# Patient Record
Sex: Female | Born: 2000 | Race: White | Hispanic: No | Marital: Married | Smoking: Never smoker
Health system: Southern US, Community
[De-identification: ages and names within clinical notes are randomized; demographics above are authoritative.]

## PROBLEM LIST (undated history)

## (undated) HISTORY — PX: APPENDECTOMY: SHX54

---

## 2021-12-18 NOTE — L&D Delivery Note (Addendum)
Delivery Note  First Stage: Labor onset: 0900 Augmentation : AROM Analgesia /Anesthesia intrapartum: epidural AROM at 2040  Second Stage: Complete dilation at 2207 Onset of pushing at 2213 FHR second stage baseline 135bpm, moderate variability, accels present, occasional decelerations  Delivery of a viable female infant 07/31/2022 at 0047 by Donato Schultz, CNM. delivery of fetal head in OA position with restitution to ROA. No nuchal cord;  Anterior then posterior shoulders delivered easily with gentle downward traction. Baby placed on mom's chest, and attended to by peds.  Cord double clamped after cessation of pulsation, cut by FOB Cord blood sample collected   Third Stage: Placenta delivered Schultz intact with 3 VC @ 0100 Trailing membranes present, removed easily with ring forceps. Placenta disposition: discarded Uterine tone firm with massage / bleeding heavy Heavy uterine bleeding, plus heavy bleeding from 3rd degree laceration and cervical laceration. Pitocin bolus, TXA, and Cytotec rectal administered, and manual sweep performed. Uterine bleeding returned to normal scant amounts and uterus firm. Notified Dr. Jean Rosenthal of actively bleeding 3rd degree and cervical lacerations and requested his presence to repair the lacerations. Laceration bleeding controlled by tamponade by CNM until Dr. Jean Rosenthal present. Pt maintained consciousness throughout the postpartum hemorrhage, but was pale, dizzy, nauseas, and vomiting. BP 110-120s/70s, HR 120s-150s, SpO2 98-100%. Received verbal consent from patient for emergent blood transfusion of 1u PRBCs, plus 1L LR bolus. Two IV lines in place.  3rd degree laceration identified, plus cervical laceration  Anesthesia for repair: epidural, fentanyl IV x 2 Repair: by Dr. Jean Rosenthal, see Dr. Edison Pace note Est. Blood Loss (mL):  Complications: postpartum hemorrhage  Mom to postpartum.  Baby to Couplet care / Skin to  Skin.  Newborn: Birth Weight: 7lb 12oz  Apgar Scores: 9, 9 Feeding planned: breastfeeding

## 2022-01-26 DIAGNOSIS — Z3493 Encounter for supervision of normal pregnancy, unspecified, third trimester: Secondary | ICD-10-CM | POA: Insufficient documentation

## 2022-02-20 LAB — OB RESULTS CONSOLE VARICELLA ZOSTER ANTIBODY, IGG: Varicella: IMMUNE

## 2022-02-20 LAB — OB RESULTS CONSOLE RUBELLA ANTIBODY, IGM: Rubella: IMMUNE

## 2022-02-20 LAB — OB RESULTS CONSOLE HEPATITIS B SURFACE ANTIGEN: Hepatitis B Surface Ag: NEGATIVE

## 2022-03-01 ENCOUNTER — Other Ambulatory Visit: Payer: Self-pay

## 2022-03-01 ENCOUNTER — Emergency Department
Admission: EM | Admit: 2022-03-01 | Discharge: 2022-03-02 | Disposition: A | Discharge status: Home / Self Care | Payer: BC Managed Care – PPO | Attending: Emergency Medicine | Admitting: Emergency Medicine

## 2022-03-01 DIAGNOSIS — R7401 Elevation of levels of liver transaminase levels: Secondary | ICD-10-CM | POA: Insufficient documentation

## 2022-03-01 DIAGNOSIS — D72829 Elevated white blood cell count, unspecified: Secondary | ICD-10-CM | POA: Diagnosis not present

## 2022-03-01 DIAGNOSIS — O99612 Diseases of the digestive system complicating pregnancy, second trimester: Secondary | ICD-10-CM | POA: Diagnosis not present

## 2022-03-01 DIAGNOSIS — R1013 Epigastric pain: Secondary | ICD-10-CM

## 2022-03-01 DIAGNOSIS — K529 Noninfective gastroenteritis and colitis, unspecified: Secondary | ICD-10-CM | POA: Insufficient documentation

## 2022-03-01 LAB — COMPREHENSIVE METABOLIC PANEL
ALT: 62 U/L — ABNORMAL HIGH (ref 0–44)
AST: 26 U/L (ref 15–41)
Albumin: 3.7 g/dL (ref 3.5–5.0)
Alkaline Phosphatase: 59 U/L (ref 38–126)
Anion gap: 9 (ref 5–15)
BUN: 9 mg/dL (ref 6–20)
CO2: 24 mmol/L (ref 22–32)
Calcium: 9.1 mg/dL (ref 8.9–10.3)
Chloride: 102 mmol/L (ref 98–111)
Creatinine, Ser: 0.55 mg/dL (ref 0.44–1.00)
GFR, Estimated: >60 mL/min (ref >60)
Glucose, Bld: 96 mg/dL (ref 70–99)
Potassium: 3.4 mmol/L — ABNORMAL LOW (ref 3.5–5.1)
Sodium: 135 mmol/L (ref 135–145)
Total Bilirubin: 0.5 mg/dL (ref 0.3–1.2)
Total Protein: 7.2 g/dL (ref 6.5–8.1)

## 2022-03-01 LAB — CBC
HCT: 38.9 % (ref 36.0–46.0)
Hemoglobin: 13.4 g/dL (ref 12.0–15.0)
MCH: 28.6 pg (ref 26.0–34.0)
MCHC: 34.4 g/dL (ref 30.0–36.0)
MCV: 82.9 fL (ref 80.0–100.0)
Platelets: 351 10*3/uL (ref 150–400)
RBC: 4.69 MIL/uL (ref 3.87–5.11)
RDW: 13.6 % (ref 11.5–15.5)
WBC: 18.8 10*3/uL — ABNORMAL HIGH (ref 4.0–10.5)
nRBC: 0 % (ref 0.0–0.2)

## 2022-03-01 LAB — HCG, QUANTITATIVE, PREGNANCY: hCG, Beta Chain, Quant, S: 45322 m[IU]/mL — ABNORMAL HIGH (ref <5)

## 2022-03-01 LAB — URINALYSIS, ROUTINE W REFLEX MICROSCOPIC
Bilirubin Urine: NEGATIVE
Glucose, UA: NEGATIVE mg/dL
Hgb urine dipstick: NEGATIVE
Ketones, ur: 20 mg/dL — AB
Nitrite: NEGATIVE
Protein, ur: 30 mg/dL — AB
Specific Gravity, Urine: 1.02 (ref 1.005–1.030)
pH: 5 (ref 5.0–8.0)

## 2022-03-01 LAB — LIPASE, BLOOD: Lipase: 28 U/L (ref 11–51)

## 2022-03-01 MED ORDER — ONDANSETRON 4 MG PO TBDP
4.0000 mg | ORAL_TABLET | Freq: Once | ORAL | Status: AC
  Administered 2022-03-01: 4 mg via ORAL

## 2022-03-01 MED ORDER — ONDANSETRON HCL 4 MG/2ML IJ SOLN
4.0000 mg | Freq: Once | INTRAMUSCULAR | Status: AC
  Administered 2022-03-02: 4 mg via INTRAVENOUS
  Filled 2022-03-01: qty 2

## 2022-03-01 MED ORDER — DEXTROSE 5 % AND 0.9 % NACL IV BOLUS
1000.0000 mL | Freq: Once | INTRAVENOUS | Status: AC
  Administered 2022-03-02: 1000 mL via INTRAVENOUS
  Filled 2022-03-01: qty 1000

## 2022-03-01 MED ORDER — FAMOTIDINE IN NACL 20-0.9 MG/50ML-% IV SOLN
20.0000 mg | Freq: Once | INTRAVENOUS | Status: AC
  Administered 2022-03-02: 20 mg via INTRAVENOUS
  Filled 2022-03-01: qty 50

## 2022-03-01 MED ORDER — ONDANSETRON 4 MG PO TBDP
ORAL_TABLET | ORAL | Status: AC
  Filled 2022-03-01: qty 1

## 2022-03-01 NOTE — ED Triage Notes (Signed)
Pt presents to ER c/o emesis and upper abd pain that started today around 1800.  Pt states she is [redacted] weeks pregnant and has had prenatal care.  Pt states she has been feeling sick for a few days.  Pt denies any issues with pregnancy thus far.  Pt is a G1P0.   ?

## 2022-03-01 NOTE — ED Notes (Signed)
Attempted to obtain fetal heart tones with doppler, audible fetal heart tones, however, unable to get count.  ?

## 2022-03-02 ENCOUNTER — Emergency Department: Payer: BC Managed Care – PPO

## 2022-03-02 MED ORDER — ACETAMINOPHEN 500 MG PO TABS
1000.0000 mg | ORAL_TABLET | Freq: Once | ORAL | Status: AC
  Administered 2022-03-02: 1000 mg via ORAL
  Filled 2022-03-02: qty 2

## 2022-03-02 MED ORDER — ONDANSETRON 4 MG PO TBDP: 4.0000 mg | ORAL_TABLET | Freq: Three times a day (TID) | ORAL | 0 refills | Status: DC | PRN

## 2022-03-02 NOTE — ED Provider Notes (Signed)
? ?[facility] ?Provider Note ? ? ? Event Date/Time  ? First MD Initiated Contact with Patient 03/01/22 2321   ?  (approximate) ? ? ?History  ? ?Emesis and Abdominal Pain ? ? ?HPI ? ?Yvette Tran is a 21 y.o. female G1, P0 currently at [redacted] weeks pregnant who presents for evaluation of abdominal pain.  Patient reports that she has had diarrhea throughout the day today.  This evening she started having upper abdominal pain and vomiting.  Has had greater than 10 episodes of nonbloody nonbilious emesis.  She reports that the abdominal pain is only present when she is vomiting and after she vomits the pain goes away.  She is status post appendectomy several years ago.  No fever or chills, no chest pain or shortness of breath, no vaginal bleeding or vaginal discharge.  Husband had same symptoms last week. ?  ? ?PMH ?None ? ?Past Surgical History ?Appendectomy ? ?Physical Exam  ? ?Triage Vital Signs: ?ED Triage Vitals  ?Enc Vitals Group  ?   BP 03/01/22 2107 114/72  ?   Pulse Rate 03/01/22 2107 (!) 105  ?   Resp 03/01/22 2107 20  ?   Temp 03/01/22 2107 98.3 ?F (36.8 ?C)  ?   Temp Source 03/01/22 2107 Oral  ?   SpO2 03/01/22 2107 97 %  ?   Weight 03/01/22 2108 160 lb (72.6 kg)  ?   Height 03/01/22 2108 5\' 4"  (1.626 m)  ?   Head Circumference --   ?   Peak Flow --   ?   Pain Score 03/01/22 2107 5  ?   Pain Loc --   ?   Pain Edu? --   ?   Excl. in GC? --   ? ? ?Most recent vital signs: ?Vitals:  ? 03/02/22 0038 03/02/22 0139  ?BP: 113/68 111/66  ?Pulse: 98 90  ?Resp: 17 17  ?Temp:    ?SpO2: 98% 99%  ? ? ? ? ?Constitutional: Alert and oriented. Well appearing and in no apparent distress. ?HEENT: ?     Head: Normocephalic and atraumatic.    ?     Eyes: Conjunctivae are normal. Sclera is non-icteric.  ?     Mouth/Throat: Mucous membranes are moist.  ?     Neck: Supple with no signs of meningismus. ?Cardiovascular: Regular rate and rhythm. No murmurs, gallops, or rubs. 2+ symmetrical distal pulses are  present in all extremities.  ?Respiratory: Normal respiratory effort. Lungs are clear to auscultation bilaterally.  ?Gastrointestinal: Gravid with mild epigastric and LUQ tenderness, and non distended with positive bowel sounds. No rebound or guarding. ?Genitourinary: No CVA tenderness. ?Musculoskeletal:  No edema, cyanosis, or erythema of extremities. ?Neurologic: Normal speech and language. Face is symmetric. Moving all extremities. No gross focal neurologic deficits are appreciated. ?Skin: Skin is warm, dry and intact. No rash noted. ?Psychiatric: Mood and affect are normal. Speech and behavior are normal. ? ?ED Results / Procedures / Treatments  ? ?Labs ?(all labs ordered are listed, but only abnormal results are displayed) ?Labs Reviewed  ?COMPREHENSIVE METABOLIC PANEL - Abnormal; Notable for the following components:  ?    Result Value  ? Potassium 3.4 (*)   ? ALT 62 (*)   ? All other components within normal limits  ?CBC - Abnormal; Notable for the following components:  ? WBC 18.8 (*)   ? All other components within normal limits  ?URINALYSIS, ROUTINE W REFLEX MICROSCOPIC - Abnormal; Notable for the  following components:  ? Color, Urine YELLOW (*)   ? APPearance HAZY (*)   ? Ketones, ur 20 (*)   ? Protein, ur 30 (*)   ? Leukocytes,Ua TRACE (*)   ? Bacteria, UA RARE (*)   ? All other components within normal limits  ?HCG, QUANTITATIVE, PREGNANCY - Abnormal; Notable for the following components:  ? hCG, Beta Nyra JabsChain, Quant, [place] [id number] (*)   ? All other components within normal limits  ?LIPASE, BLOOD  ? ? ? ?EKG ? ?none ? ? ?RADIOLOGY ?I, Nita Sicklearolina Saphyre Cillo, attending MD, have personally viewed and interpreted the images obtained during this visit as below: ? ?Right upper quadrant ultrasound negative ? ? ?___________________________________________________ ?Interpretation by Radiologist:  ?US ABDOMEN LIMITED RUQ (LIVER/GB) ? ?Result Date: 03/02/2022 ?CLINICAL DATA:  Epigastric pain tonight. Eighteen weeks pregnant  currently. EXAM: ULTRASOUND ABDOMEN LIMITED RIGHT UPPER QUADRANT COMPARISON:  None. FINDINGS: Gallbladder: No gallstones or wall thickening visualized. No sonographic Murphy sign noted by sonographer. Common bile duct: Diameter: 2.5 mm, normal Liver: No focal lesion identified. Within normal limits in parenchymal echogenicity. Portal vein is patent on color Doppler imaging with normal direction of blood flow towards the liver. Other: None. IMPRESSION: Normal examination. No evidence of cholelithiasis or acute cholecystitis. Electronically Signed   By: Burman NievesWilliam  Stevens M.D.   On: 03/02/2022 02:09   ? ? ? ?PROCEDURES: ? ?Critical Care performed: No ? ?Procedures ? ? ? ?IMPRESSION / MDM / ASSESSMENT AND PLAN / ED COURSE  ?I reviewed the triage vital signs and the nursing notes. ? ?21 y.o. female G1, P0 currently at 5118 weeks pregnant who presents for evaluation of abdominal pain, vomiting, and diarrhea.  Patient is well-appearing in no distress, abdomen is gravid with mild epigastric and left upper quadrant tenderness, no rebound or guarding.  Vitals are within normal limits. ? ?Ddx: Viral gastroenteritis versus food poisoning versus pancreatitis versus gallbladder pathology versus UTI ? ? ?Plan: CBC, CMP, lipase, urinalysis, hCG, right upper quadrant ultrasound.  Will give IV Zofran, IV fluids, IV Pepcid, and Tylenol ? ? ?MEDICATIONS GIVEN IN ED: ?Medications  ?ondansetron (ZOFRAN-ODT) disintegrating tablet 4 mg (4 mg Oral Given 03/01/22 2113)  ?dextrose 5 % and 0.9% NaCl 5-0.9 % bolus 1,000 mL (0 mLs Intravenous Stopped 03/02/22 0045)  ?famotidine (PEPCID) IVPB 20 mg premix (0 mg Intravenous Stopped 03/02/22 0037)  ?ondansetron [facility](ZOFRAN) injection 4 mg (4 mg Intravenous Given 03/02/22 0002)  ?acetaminophen (TYLENOL) tablet 1,000 mg (1,000 mg Oral Given 03/02/22 0208)  ? ? ? ?ED COURSE: Labs showing elevated white count of 18.8 most likely from a viral process plus pregnancy.  Normal electrolytes with no significant  electrolyte derangements.  Slightly elevated ALT of 62 but normal AST and T. bili, normal lipase.  UA with no signs of infection showing mild ketones.  Right upper quadrant ultrasound negative for cholelithiasis or acute cholecystitis.  Bedside ultrasound showing normal IUP with a fetal heart rate of 149 and good fetal movement.  Patient symptoms resolved and she was tolerating p.o. no further episodes of vomiting or diarrhea in the emergency room.  Admission was considered but felt unnecessary with reassuring work-up and resolution of her symptoms.  We discussed rehydration, nausea control at home, and bland diet.  Recommended close follow-up with her primary care doctor and discussed my standard return precautions. ? ? ?Consults: None ? ? ?EMR reviewed including her initial prenatal visit with her OB from 10 days ago ? ? ? ?FINAL CLINICAL IMPRESSION(S) / ED DIAGNOSES  ? ?  Final diagnoses:  ?Epigastric abdominal pain  ?Nausea vomiting and diarrhea  ?Gastroenteritis  ? ? ? ?Rx / DC Orders  ? ?ED Discharge Orders   ? ?      Ordered  ?  ondansetron (ZOFRAN-ODT) 4 MG disintegrating tablet  Every 8 hours PRN       ? 03/02/22 0238  ? ?  ?  ? ?  ? ? ? ?Note:  This document was prepared using Dragon voice recognition software and may include unintentional dictation errors. ? ? ?Please note:  Patient was evaluated in Emergency Department today for the symptoms described in the history of present illness. Patient was evaluated in the context of the global COVID-19 pandemic, which necessitated consideration that the patient might be at risk for infection with the SARS-CoV-2 virus that causes COVID-19. Institutional protocols and algorithms that pertain to the evaluation of patients at risk for COVID-19 are in a state of rapid change based on information released by regulatory bodies including the CDC and federal and state organizations. These policies and algorithms were followed during the patient's care in the ED.  Some ED  evaluations and interventions may be delayed as a result of limited staffing during the pandemic. ? ? ? ? ?  ?Nita Sickle, MD ?03/02/22 0245 ? ?

## 2022-03-02 NOTE — ED Notes (Signed)
Patient states she took several sips of ginger ale and has, so far, been able to keep it down. No further needs at this time, VS are stable. Spouse remains at the bedside. ?

## 2022-03-02 NOTE — ED Notes (Signed)
Patient provided ginger ale for PO challenge 

## 2022-07-11 LAB — OB RESULTS CONSOLE GBS: GBS: NEGATIVE

## 2022-07-11 LAB — OB RESULTS CONSOLE RPR: RPR: NONREACTIVE

## 2022-07-11 LAB — OB RESULTS CONSOLE GC/CHLAMYDIA
Chlamydia: NEGATIVE
Neisseria Gonorrhea: NEGATIVE

## 2022-07-11 LAB — OB RESULTS CONSOLE HIV ANTIBODY (ROUTINE TESTING): HIV: NONREACTIVE

## 2022-07-30 ENCOUNTER — Inpatient Hospital Stay
Admission: EM | Admit: 2022-07-30 | Discharge: 2022-08-02 | DRG: 768 | Disposition: A | Discharge status: Home / Self Care | Payer: BC Managed Care – PPO | Attending: Obstetrics | Admitting: Obstetrics

## 2022-07-30 ENCOUNTER — Encounter: Payer: Self-pay | Admitting: Obstetrics and Gynecology

## 2022-07-30 ENCOUNTER — Other Ambulatory Visit: Payer: Self-pay

## 2022-07-30 ENCOUNTER — Inpatient Hospital Stay: Payer: BC Managed Care – PPO | Admitting: Anesthesiology

## 2022-07-30 DIAGNOSIS — Z3A39 39 weeks gestation of pregnancy: Secondary | ICD-10-CM

## 2022-07-30 DIAGNOSIS — D62 Acute posthemorrhagic anemia: Secondary | ICD-10-CM | POA: Diagnosis not present

## 2022-07-30 DIAGNOSIS — O26893 Other specified pregnancy related conditions, third trimester: Secondary | ICD-10-CM | POA: Diagnosis present

## 2022-07-30 DIAGNOSIS — O9081 Anemia of the puerperium: Secondary | ICD-10-CM | POA: Diagnosis not present

## 2022-07-30 DIAGNOSIS — O479 False labor, unspecified: Principal | ICD-10-CM | POA: Diagnosis present

## 2022-07-30 LAB — CBC
HCT: 38.2 % (ref 36.0–46.0)
Hemoglobin: 13.1 g/dL (ref 12.0–15.0)
MCH: 27.7 pg (ref 26.0–34.0)
MCHC: 34.3 g/dL (ref 30.0–36.0)
MCV: 80.8 fL (ref 80.0–100.0)
Platelets: 329 10*3/uL (ref 150–400)
RBC: 4.73 MIL/uL (ref 3.87–5.11)
RDW: 13.2 % (ref 11.5–15.5)
WBC: 12.2 10*3/uL — ABNORMAL HIGH (ref 4.0–10.5)
nRBC: 0 % (ref 0.0–0.2)

## 2022-07-30 LAB — ABO/RH: ABO/RH(D): O POS

## 2022-07-30 MED ORDER — LACTATED RINGERS IV SOLN: 500.0000 mL | INTRAVENOUS | Status: DC | PRN

## 2022-07-30 MED ORDER — EPHEDRINE 5 MG/ML INJ: 10.0000 mg | INTRAVENOUS | Status: DC | PRN

## 2022-07-30 MED ORDER — AMMONIA AROMATIC IN INHA
RESPIRATORY_TRACT | Status: AC
  Filled 2022-07-30: qty 10

## 2022-07-30 MED ORDER — ACETAMINOPHEN 325 MG PO TABS
650.0000 mg | ORAL_TABLET | ORAL | Status: DC | PRN
Start: 2022-07-30 — End: 2022-07-31

## 2022-07-30 MED ORDER — MISOPROSTOL 200 MCG PO TABS
ORAL_TABLET | ORAL | Status: AC
  Administered 2022-07-31: 800 ug
  Filled 2022-07-30: qty 4

## 2022-07-30 MED ORDER — LIDOCAINE HCL (PF) 1 % IJ SOLN
INTRAMUSCULAR | Status: DC | PRN
  Administered 2022-07-30: 3 mL via SUBCUTANEOUS

## 2022-07-30 MED ORDER — ONDANSETRON HCL 4 MG/2ML IJ SOLN
4.0000 mg | Freq: Four times a day (QID) | INTRAMUSCULAR | Status: DC | PRN
  Administered 2022-07-31: 4 mg via INTRAVENOUS
  Filled 2022-07-30: qty 2

## 2022-07-30 MED ORDER — PHENYLEPHRINE 80 MCG/ML (10ML) SYRINGE FOR IV PUSH (FOR BLOOD PRESSURE SUPPORT): 80.0000 ug | PREFILLED_SYRINGE | INTRAVENOUS | Status: DC | PRN

## 2022-07-30 MED ORDER — LACTATED RINGERS IV SOLN
500.0000 mL | Freq: Once | INTRAVENOUS | Status: AC
  Administered 2022-07-30: 500 mL via INTRAVENOUS

## 2022-07-30 MED ORDER — FENTANYL-BUPIVACAINE-NACL 0.5-0.125-0.9 MG/250ML-% EP SOLN
12.0000 mL/h | EPIDURAL | Status: DC | PRN
  Administered 2022-07-30: 12 mL/h via EPIDURAL

## 2022-07-30 MED ORDER — OXYTOCIN 10 UNIT/ML IJ SOLN
INTRAMUSCULAR | Status: AC
  Filled 2022-07-30: qty 2

## 2022-07-30 MED ORDER — OXYTOCIN-SODIUM CHLORIDE 30-0.9 UT/500ML-% IV SOLN
INTRAVENOUS | Status: AC
  Filled 2022-07-30: qty 500

## 2022-07-30 MED ORDER — FENTANYL CITRATE (PF) 100 MCG/2ML IJ SOLN
50.0000 ug | INTRAMUSCULAR | Status: DC | PRN
  Administered 2022-07-31: 50 ug via INTRAVENOUS
  Filled 2022-07-30: qty 2

## 2022-07-30 MED ORDER — LACTATED RINGERS IV SOLN: INTRAVENOUS | Status: DC

## 2022-07-30 MED ORDER — OXYTOCIN BOLUS FROM INFUSION
333.0000 mL | Freq: Once | INTRAVENOUS | Status: AC
  Administered 2022-07-31: 333 mL via INTRAVENOUS

## 2022-07-30 MED ORDER — FENTANYL-BUPIVACAINE-NACL 0.5-0.125-0.9 MG/250ML-% EP SOLN
EPIDURAL | Status: AC
  Filled 2022-07-30: qty 250

## 2022-07-30 MED ORDER — SODIUM CHLORIDE 0.9 % IV SOLN
INTRAVENOUS | Status: DC | PRN
  Administered 2022-07-30: 9 mL via EPIDURAL

## 2022-07-30 MED ORDER — LIDOCAINE HCL (PF) 1 % IJ SOLN: 30.0000 mL | INTRAMUSCULAR | Status: DC | PRN

## 2022-07-30 MED ORDER — LIDOCAINE HCL (PF) 1 % IJ SOLN
INTRAMUSCULAR | Status: AC
  Filled 2022-07-30: qty 30

## 2022-07-30 MED ORDER — SOD CITRATE-CITRIC ACID 500-334 MG/5ML PO SOLN: 30.0000 mL | ORAL | Status: DC | PRN

## 2022-07-30 MED ORDER — LIDOCAINE-EPINEPHRINE (PF) 1.5 %-1:200000 IJ SOLN
INTRAMUSCULAR | Status: DC | PRN
  Administered 2022-07-30: 3 mL via EPIDURAL

## 2022-07-30 MED ORDER — DIPHENHYDRAMINE HCL 50 MG/ML IJ SOLN: 12.5000 mg | INTRAMUSCULAR | Status: DC | PRN

## 2022-07-30 MED ORDER — OXYTOCIN-SODIUM CHLORIDE 30-0.9 UT/500ML-% IV SOLN
2.5000 [IU]/h | INTRAVENOUS | Status: DC
Start: 2022-07-30 — End: 2022-07-31
  Administered 2022-07-31: 2.5 [IU]/h via INTRAVENOUS
  Filled 2022-07-30: qty 500

## 2022-07-30 NOTE — H&P (Signed)
OB History & Physical   History of Present Illness:  Chief Complaint:   HPI:  Yvette Tran is a 21 y.o. G1P0 female at [redacted]w[redacted]d dated by LMP.  She presents to L&D for uterine contractions.  Active FM onset of ctx @ 0900 currently every 3-4 minutes Denies LOF.    Pregnancy Issues: 1. Low-risk pregnancy   Maternal Medical History:  History reviewed. No pertinent past medical history.  Past Surgical History:  Procedure Laterality Date   APPENDECTOMY      No Known Allergies  Prior to Admission medications   Medication Sig Start Date End Date Taking? Authorizing Provider  Prenatal Vit-Fe Fumarate-FA (MULTIVITAMIN-PRENATAL) 27-0.8 MG TABS tablet Take 1 tablet by mouth daily at 12 noon.   Yes [provider]  ondansetron (ZOFRAN-ODT) 4 MG disintegrating tablet Take 1 tablet (4 mg total) by mouth every 8 (eight) hours as needed for nausea or vomiting. 03/02/22   Nita Sickle, MD    Prenatal care site: [facility]   Social History: She  reports that she has never smoked. She has never used smokeless tobacco. She reports that she does not drink alcohol and does not use drugs.  Family History: family history is not on file.   Review of Systems: A full review of systems was performed and negative except as noted in the HPI.     Physical Exam:  Vital Signs: There were no vitals taken for this visit. General: no acute distress.  HEENT: normocephalic, atraumatic Heart: regular rate & rhythm.  No murmurs/rubs/gallops Lungs: clear to auscultation bilaterally, normal respiratory effort Abdomen: soft, gravid, non-tender;  EFW: 7.5lb Pelvic:   External: Normal external female genitalia  Cervix: Dilation: 6 / Effacement (%): 100 / Station: 0    Extremities: non-tender, symmetric, no edema bilaterally.  DTRs: +2  Neurologic: Alert & oriented x 3.    No results found for this or any previous visit (from the past 24 hour(s)).  Pertinent Results:  Prenatal  Labs: Blood type/Rh O pos  Antibody screen neg  Rubella Immune  Varicella Immune  RPR NR  HBsAg Neg  HIV NR  GC neg  Chlamydia neg  Genetic screening negative  1 hour GTT 104  3 hour GTT N/a  GBS Negative   FHT: 130bpm, moderate variability, accels present, no decelerations TOCO: contractions q3-70min SVE:  Dilation: 6 / Effacement (%): 100 / Station: 0    Cephalic by leopolds  No results found.  Assessment:  Yvette Tran is a 21 y.o. G1P0 female at [redacted]w[redacted]d with active labor.   Plan:  1. Admit to Labor & Delivery; consents reviewed and obtained  2. Fetal Well being  - Fetal Tracing: Cat I tracing - Group B Streptococcus ppx indicated: n/a, GBS negative - Presentation: vertex confirmed by SVE   3. Routine OB: - Prenatal labs reviewed, as above - Rh pos - CBC, T&S, RPR on admit - Clear fluids, IVF  4. Monitoring of Labor -  Contractions q3-39min, external toco in place -  Plan for continuous fetal monitoring  -  Maternal pain control as desired; requesting regional anesthesia - Anticipate vaginal delivery  5. Post Partum Planning: - Infant feeding: breastfeeding - Contraception: none  Janyce Llanos, CNM 07/30/22 5:47 PM

## 2022-07-30 NOTE — Progress Notes (Signed)
Labor Progress Note  Yvette Tran is a 21 y.o. G1P0 at [redacted]w[redacted]d by LMP admitted for active labor  Subjective: She is comfortable after her epidural  Objective: BP 109/66   Pulse 86   Temp 98.3 F (36.8 C) (Oral)   Resp 19   Ht 5\' 4"  (1.626 m)   Wt 85.7 kg   SpO2 99%   BMI 32.44 kg/m  Notable VS details: reviewed  Fetal Assessment: FHT:  FHR: 125 bpm, variability: moderate,  accelerations:  Present,  decelerations:  Absent Category/reactivity:  Category I UC:   regular, every 2-4 minutes SVE:    Dilation: 9cm  Effacement: 100%  Station:  +1  Consistency: soft  Position: anterior  Membrane status:AROM @ 2040 Amniotic color: clear  Labs: Lab Results  Component Value Date   WBC 12.2 (H) 07/30/2022   HGB 13.1 07/30/2022   HCT 38.2 07/30/2022   MCV 80.8 07/30/2022   PLT 329 07/30/2022    Assessment / Plan: Spontaneous labor, progressing normally  Labor:  Progressing normally, comfortable after epidural, AROM Preeclampsia:  no signs or symptoms of toxicity Fetal Wellbeing:  Category I Pain Control:  Epidural I/D:   GBS negative Anticipated MOD:  NSVD  08/01/2022, CNM 07/30/2022, 9:15 PM

## 2022-07-30 NOTE — Anesthesia Procedure Notes (Signed)
Epidural Patient location during procedure: OB  Staffing Anesthesiologist: Corinda Gubler, MD Performed: anesthesiologist   Preanesthetic Checklist Completed: patient identified, IV checked, site marked, risks and benefits discussed, surgical consent, monitors and equipment checked, pre-op evaluation and timeout performed  Epidural Patient position: sitting Prep: ChloraPrep Patient monitoring: heart rate, continuous pulse ox and blood pressure Approach: midline Location: L3-L4 Injection technique: LOR saline  Needle:  Needle type: Tuohy  Needle gauge: 17 G Needle length: 9 cm Needle insertion depth: 7 cm Catheter type: closed end flexible Catheter size: 19 Gauge Catheter at skin depth: 12 cm Test dose: negative and 1.5% lidocaine with Epi 1:200 K  Assessment Sensory level: T10 Events: blood not aspirated, injection not painful, no injection resistance, paresthesia and negative IV test  Additional Notes First/one attempt. Patient had transient paresthesia down right leg on advancement of catheter through Tuohy, that self-resolved rapidly. No further paresthesias on test dose or bolus dose administration  Pt. Evaluated and documentation done after procedure finished. Patient identified. Risks/Benefits/Options discussed with patient including but not limited to bleeding, infection, nerve damage, paralysis, failed block, incomplete pain control, headache, blood pressure changes, nausea, vomiting, reactions to medication both or allergic, itching and postpartum back pain. Confirmed with bedside nurse the patient's most recent platelet count. Confirmed with patient that they are not currently taking any anticoagulation, have any bleeding history or any family history of bleeding disorders. Patient expressed understanding and wished to proceed. All questions were answered. Sterile technique was used throughout the entire procedure. Please see nursing notes for vital signs. Test dose was  given through epidural catheter and negative prior to continuing to dose epidural or start infusion. Warning signs of high block given to the patient including shortness of breath, tingling/numbness in hands, complete motor block, or any concerning symptoms with instructions to call for help. Patient was given instructions on fall risk and not to get out of bed. All questions and concerns addressed with instructions to call with any issues or inadequate analgesia.     Patient tolerated the insertion well without immediate complications.  Reason for block: procedure for painReason for block:procedure for pain

## 2022-07-30 NOTE — Anesthesia Preprocedure Evaluation (Signed)
Anesthesia Evaluation  Patient identified by MRN, date of birth, ID band Patient awake    Reviewed: Allergy & Precautions, NPO status , Patient's Chart, lab work & pertinent test results  History of Anesthesia Complications Negative for: history of anesthetic complications  Airway Mallampati: II  TM Distance: >3 FB Neck ROM: Full    Dental no notable dental hx. (+) Teeth Intact   Pulmonary neg pulmonary ROS, neg sleep apnea, neg COPD, Patient abstained from smoking.Not current smoker,    Pulmonary exam normal breath sounds clear to auscultation       Cardiovascular Exercise Tolerance: Good METS(-) hypertension(-) CAD and (-) Past MI negative cardio ROS  (-) dysrhythmias  Rhythm:Regular Rate:Normal - Systolic murmurs    Neuro/Psych negative neurological ROS  negative psych ROS   GI/Hepatic neg GERD  ,(+)     (-) substance abuse  ,   Endo/Other  neg diabetes  Renal/GU negative Renal ROS     Musculoskeletal   Abdominal   Peds  Hematology   Anesthesia Other Findings History reviewed. No pertinent past medical history.  Reproductive/Obstetrics (+) Pregnancy                             Anesthesia Physical Anesthesia Plan  ASA: 2  Anesthesia Plan: Epidural   Post-op Pain Management:    Induction:   PONV Risk Score and Plan: 2 and Treatment may vary due to age or medical condition and Ondansetron  Airway Management Planned: Natural Airway  Additional Equipment:   Intra-op Plan:   Post-operative Plan:   Informed Consent: I have reviewed the patients History and Physical, chart, labs and discussed the procedure including the risks, benefits and alternatives for the proposed anesthesia with the patient or authorized representative who has indicated his/her understanding and acceptance.       Plan Discussed with: Surgeon  Anesthesia Plan Comments: (Discussed R/B/A of  neuraxial anesthesia technique with patient: - rare risks of spinal/epidural hematoma, nerve damage, infection - Risk of PDPH - Risk of itching - Risk of nausea and vomiting - Risk of poor block necessitating replacement of epidural. - Risk of allergic reactions. Patient voiced understanding.)        Anesthesia Quick Evaluation  

## 2022-07-31 ENCOUNTER — Encounter: Payer: Self-pay | Admitting: Obstetrics and Gynecology

## 2022-07-31 LAB — CBC
HCT: 34.6 % — ABNORMAL LOW (ref 36.0–46.0)
HCT: 27.1 % — ABNORMAL LOW (ref 36.0–46.0)
Hemoglobin: 11.7 g/dL — ABNORMAL LOW (ref 12.0–15.0)
Hemoglobin: 9.5 g/dL — ABNORMAL LOW (ref 12.0–15.0)
MCH: 27.9 pg (ref 26.0–34.0)
MCH: 29 pg (ref 26.0–34.0)
MCHC: 33.8 g/dL (ref 30.0–36.0)
MCHC: 35.1 g/dL (ref 30.0–36.0)
MCV: 82.6 fL (ref 80.0–100.0)
MCV: 82.6 fL (ref 80.0–100.0)
Platelets: 395 10*3/uL (ref 150–400)
Platelets: 246 10*3/uL (ref 150–400)
RBC: 4.19 MIL/uL (ref 3.87–5.11)
RBC: 3.28 MIL/uL — ABNORMAL LOW (ref 3.87–5.11)
RDW: 13.4 % (ref 11.5–15.5)
RDW: 13.6 % (ref 11.5–15.5)
WBC: 16.9 10*3/uL — ABNORMAL HIGH (ref 4.0–10.5)
WBC: 15.6 10*3/uL — ABNORMAL HIGH (ref 4.0–10.5)
nRBC: 0 % (ref 0.0–0.2)
nRBC: 0 % (ref 0.0–0.2)

## 2022-07-31 LAB — DIC (DISSEMINATED INTRAVASCULAR COAGULATION)PANEL
D-Dimer, Quant: 2.89 ug/mL-FEU — ABNORMAL HIGH (ref 0.00–0.50)
Fibrinogen: 541 mg/dL — ABNORMAL HIGH (ref 210–475)
INR: 1.1 (ref 0.8–1.2)
Platelets: 390 10*3/uL (ref 150–400)
Prothrombin Time: 13.7 seconds (ref 11.4–15.2)
Smear Review: NONE SEEN
aPTT: 24 seconds (ref 24–36)

## 2022-07-31 LAB — RPR: RPR Ser Ql: NONREACTIVE

## 2022-07-31 LAB — HEMOGLOBIN AND HEMATOCRIT, BLOOD
HCT: 31.1 % — ABNORMAL LOW (ref 36.0–46.0)
Hemoglobin: 10.8 g/dL — ABNORMAL LOW (ref 12.0–15.0)

## 2022-07-31 LAB — PREPARE RBC (CROSSMATCH)

## 2022-07-31 MED ORDER — LACTATED RINGERS IV SOLN: INTRAVENOUS | Status: DC

## 2022-07-31 MED ORDER — OXYCODONE HCL 5 MG PO TABS
5.0000 mg | ORAL_TABLET | ORAL | Status: DC | PRN
  Administered 2022-07-31 – 2022-08-02 (×7): 5 mg via ORAL
  Filled 2022-07-31 (×7): qty 1

## 2022-07-31 MED ORDER — SODIUM CHLORIDE 0.9% IV SOLUTION: Freq: Once | INTRAVENOUS | Status: DC

## 2022-07-31 MED ORDER — ONDANSETRON 4 MG PO TBDP: 4.0000 mg | ORAL_TABLET | Freq: Three times a day (TID) | ORAL | Status: DC | PRN

## 2022-07-31 MED ORDER — WITCH HAZEL-GLYCERIN EX PADS
1.0000 | MEDICATED_PAD | CUTANEOUS | Status: DC | PRN
  Filled 2022-07-31 (×2): qty 100

## 2022-07-31 MED ORDER — FERROUS SULFATE 325 (65 FE) MG PO TABS
325.0000 mg | ORAL_TABLET | Freq: Two times a day (BID) | ORAL | Status: DC
  Administered 2022-07-31 – 2022-08-02 (×4): 325 mg via ORAL
  Filled 2022-07-31 (×4): qty 1

## 2022-07-31 MED ORDER — TRANEXAMIC ACID-NACL 1000-0.7 MG/100ML-% IV SOLN
INTRAVENOUS | Status: AC
  Administered 2022-07-31: 1000 mg
  Filled 2022-07-31: qty 100

## 2022-07-31 MED ORDER — DIBUCAINE (PERIANAL) 1 % EX OINT
1.0000 | TOPICAL_OINTMENT | CUTANEOUS | Status: DC | PRN
  Filled 2022-07-31 (×2): qty 28

## 2022-07-31 MED ORDER — PRENATAL MULTIVITAMIN CH
1.0000 | ORAL_TABLET | Freq: Every day | ORAL | Status: DC
  Administered 2022-07-31 – 2022-08-01 (×2): 1 via ORAL
  Filled 2022-07-31 (×2): qty 1

## 2022-07-31 MED ORDER — DIPHENHYDRAMINE HCL 25 MG PO CAPS: 25.0000 mg | ORAL_CAPSULE | Freq: Four times a day (QID) | ORAL | Status: DC | PRN

## 2022-07-31 MED ORDER — FENTANYL CITRATE (PF) 100 MCG/2ML IJ SOLN
50.0000 ug | Freq: Once | INTRAMUSCULAR | Status: DC
  Filled 2022-07-31: qty 2

## 2022-07-31 MED ORDER — SODIUM CHLORIDE 0.9 % IV SOLN
300.0000 mg | INTRAVENOUS | Status: DC
  Administered 2022-07-31: 300 mg via INTRAVENOUS
  Filled 2022-07-31: qty 15

## 2022-07-31 MED ORDER — TETANUS-DIPHTH-ACELL PERTUSSIS 5-2.5-18.5 LF-MCG/0.5 IM SUSY: 0.5000 mL | PREFILLED_SYRINGE | Freq: Once | INTRAMUSCULAR | Status: AC

## 2022-07-31 MED ORDER — SENNOSIDES-DOCUSATE SODIUM 8.6-50 MG PO TABS
2.0000 | ORAL_TABLET | Freq: Every day | ORAL | Status: DC
  Administered 2022-08-01 – 2022-08-02 (×2): 2 via ORAL
  Filled 2022-07-31 (×2): qty 2

## 2022-07-31 MED ORDER — ACETAMINOPHEN 325 MG PO TABS
650.0000 mg | ORAL_TABLET | ORAL | Status: DC | PRN
  Administered 2022-07-31 – 2022-08-01 (×2): 650 mg via ORAL
  Filled 2022-07-31 (×2): qty 2

## 2022-07-31 MED ORDER — DOCUSATE SODIUM 100 MG PO CAPS
100.0000 mg | ORAL_CAPSULE | Freq: Two times a day (BID) | ORAL | Status: DC
  Administered 2022-08-01 – 2022-08-02 (×3): 100 mg via ORAL
  Filled 2022-07-31 (×3): qty 1

## 2022-07-31 MED ORDER — IBUPROFEN 600 MG PO TABS
600.0000 mg | ORAL_TABLET | Freq: Four times a day (QID) | ORAL | Status: DC
  Administered 2022-07-31 – 2022-08-02 (×9): 600 mg via ORAL
  Filled 2022-07-31 (×9): qty 1

## 2022-07-31 MED ORDER — LACTATED RINGERS IV BOLUS: 1000.0000 mL | Freq: Once | INTRAVENOUS | Status: DC

## 2022-07-31 MED ORDER — BENZOCAINE-MENTHOL 20-0.5 % EX AERO
1.0000 | INHALATION_SPRAY | CUTANEOUS | Status: DC | PRN
  Filled 2022-07-31: qty 56

## 2022-07-31 MED ORDER — SIMETHICONE 80 MG PO CHEW: 80.0000 mg | CHEWABLE_TABLET | ORAL | Status: DC | PRN

## 2022-07-31 MED ORDER — ZOLPIDEM TARTRATE 5 MG PO TABS: 5.0000 mg | ORAL_TABLET | Freq: Every evening | ORAL | Status: DC | PRN

## 2022-07-31 MED ORDER — ONDANSETRON HCL 4 MG PO TABS: 4.0000 mg | ORAL_TABLET | ORAL | Status: DC | PRN

## 2022-07-31 MED ORDER — COCONUT OIL OIL: 1.0000 | TOPICAL_OIL | Status: DC | PRN

## 2022-07-31 MED ORDER — FENTANYL CITRATE (PF) 100 MCG/2ML IJ SOLN
50.0000 ug | Freq: Once | INTRAMUSCULAR | Status: AC
  Administered 2022-07-31: 50 ug via INTRAVENOUS

## 2022-07-31 MED ORDER — ONDANSETRON HCL 4 MG/2ML IJ SOLN: 4.0000 mg | INTRAMUSCULAR | Status: DC | PRN

## 2022-07-31 NOTE — Discharge Summary (Signed)
Obstetrical Discharge Summary  Patient Name: Yvette Tran DOB: 2001/08/28 MRN: [id number]  Date of Admission: 07/30/2022 Date of Delivery: 07/31/2022 Delivered by: Donato Schultz, CNM Date of Discharge: 08/02/22  Primary OB: [facility]  LMP:10/25/21 EDC Estimated Date of Delivery: 08/01/22 Gestational Age at Delivery: [redacted]w[redacted]d   Antepartum complications:  Low risk pregnancy Admitting Diagnosis: active labor, NSVD Secondary Diagnosis: postpartum hemorrhage, 3rd degree laceration Patient Active Problem List   Diagnosis Date Noted   Postpartum hemorrhage, delivered 08/01/2022   Acute blood loss anemia 08/01/2022   Uterine contractions 07/30/2022   Encounter for supervision of normal pregnancy in third trimester 01/26/2022    Augmentation: AROM Complications: Hemorrhage>1038mL Intrapartum complications/course: She arrived with uterine contractions and received an epidural, then progressed to 10/100/+2 and pushed for 2.5 hours, delivering viable female infant. 3rd degree laceration and cervical laceration repaired by Dr. Jean Rosenthal, postpartum hemorrhage of . Received 1 unit PRBCs emergently and Venofer postpartum. Date of Delivery: 07/31/2022 Delivered By: Donato Schultz, CNM Delivery Type: spontaneous vaginal delivery Anesthesia: epidural Placenta: spontaneous Laceration: 3rd degree, cervical Episiotomy: none Newborn Data: Live born female "Marlan Palau" Birth Weight:  7lb 12oz APGAR: 9, 9  Newborn Delivery   Birth date/time: 07/31/2022 00:47:00 Delivery type: Vaginal, Spontaneous      Postpartum Procedures: blood transfusion, IV Venofer Edinburgh:     07/31/2022    4:10 PM  Edinburgh Postnatal Depression Scale Screening Tool  I have been able to laugh and see the funny side of things. 0  I have looked forward with enjoyment to things. 0  I have blamed myself unnecessarily when things went wrong. 2  I have been anxious or worried for no good reason. 1  I have  felt scared or panicky for no good reason. 1  Things have been getting on top of me. 1  I have been so unhappy that I have had difficulty sleeping. 0  I have felt sad or miserable. 0  I have been so unhappy that I have been crying. 0  The thought of harming myself has occurred to me. 0  Edinburgh Postnatal Depression Scale Total 5    Post partum course:  Patient had a postpartum course complicated by postpartum hemorrhage and blood transfusion.  By time of discharge on PPD#2, her pain was controlled on oral pain medications; she had appropriate lochia and was ambulating, voiding without difficulty and tolerating regular diet.  She was deemed stable for discharge to home.     Discharge Physical Exam:  BP 113/71 (BP Location: Right Arm)   Pulse 88   Temp 98.4 F (36.9 C) (Oral)   Resp 20   Ht 5\' 4"  (1.626 m)   Wt 85.7 kg   SpO2 100%   Breastfeeding Unknown   BMI 32.44 kg/m   General: NAD CV: RRR Pulm: CTABL, nl effort ABD: s/nd/nt, fundus firm and below the umbilicus Lochia: moderate Perineum: well approximated/intact DVT Evaluation: LE non-ttp, no evidence of DVT on exam.  Hemoglobin  Date Value Ref Range Status  08/02/2022 9.5 (L) 12.0 - 15.0 g/dL Final   HCT  Date Value Ref Range Status  08/02/2022 28.0 (L) 36.0 - 46.0 % Final    Disposition: stable, discharge to home. Baby Feeding: breastmilk Baby Disposition: home with mom  Rh Immune globulin given: O pos Rubella vaccine given: immune Varicella vaccine given: immune Tdap vaccine given in AP or PP setting: 06/08/22 Flu vaccine given in AP or PP setting: give in season  Contraception: declines  Prenatal Labs:  Blood type/Rh O pos  Antibody screen neg  Rubella Immune  Varicella Immune  RPR NR  HBsAg Neg  HIV NR  GC neg  Chlamydia neg  Genetic screening negative  1 hour GTT 104  3 hour GTT N/a  GBS Negative     Plan:  Yvette Tran was discharged to home in good condition. Follow-up appointment  with delivering provider in 6 weeks.  Discharge Medications: Allergies as of 08/02/2022   No Known Allergies      Medication List     STOP taking these medications    ondansetron 4 MG disintegrating tablet Commonly known as: ZOFRAN-ODT       TAKE these medications    acetaminophen 325 MG tablet Commonly known as: Tylenol Take 2 tablets (650 mg total) by mouth every 4 (four) hours as needed (for pain scale < 4).   benzocaine-Menthol 20-0.5 % Aero Commonly known as: DERMOPLAST Apply 1 Application topically as needed for irritation (perineal discomfort).   coconut oil Oil Apply 1 Application topically as needed.   dibucaine 1 % Oint Commonly known as: NUPERCAINAL Place 1 Application rectally as needed for hemorrhoids.   docusate sodium 100 MG capsule Commonly known as: COLACE Take 1 capsule (100 mg total) by mouth 2 (two) times daily.   ferrous sulfate 325 (65 FE) MG tablet Take 1 tablet (325 mg total) by mouth 2 (two) times daily with a meal.   ibuprofen 600 MG tablet Commonly known as: ADVIL Take 1 tablet (600 mg total) by mouth every 6 (six) hours.   multivitamin-prenatal 27-0.8 MG Tabs tablet Take 1 tablet by mouth daily at 12 noon.   senna-docusate 8.6-50 MG tablet Commonly known as: Senokot-S Take 2 tablets by mouth daily. Start taking on: August 03, 2022   simethicone 80 MG chewable tablet Commonly known as: MYLICON Chew 1 tablet (80 mg total) by mouth as needed for flatulence.   witch hazel-glycerin pad Commonly known as: TUCKS Apply 1 Application topically as needed for hemorrhoids.         Follow-up Information     Conard Novak, MD Follow up in 2 week(s).   Specialty: Obstetrics and Gynecology Why: laceration check Contact information: [address] [place] [address] [phone]         Janyce Llanos, CNM Follow up in 6 week(s).   Specialty: Certified Nurse Midwife Why: 6wk postpartum Contact  information: [address] [place] [address] [phone]                 Signed:  Sonny Dandy, CNM 08/02/2022 10:16 AM

## 2022-07-31 NOTE — Lactation Note (Signed)
This note was copied from a baby's chart. Lactation Consultation Note  Patient Name: Yvette Tran Today's Date: 07/31/2022 Reason for consult: Initial assessment;Primapara;Term Age:23 hours  Maternal Data Has patient been taught Hand Expression?: Yes Does the patient have breastfeeding experience prior to this delivery?: No  P1, SVD 9hrs ago. Desires exclusive BF.  Feeding Mother's Current Feeding Choice: Breast Milk  Baby has fed since delivery and also received supplementation of donor breast milk.  LATCH Score Latch: Repeated attempts needed to sustain latch, nipple held in mouth throughout feeding, stimulation needed to elicit sucking reflex.  Audible Swallowing: A few with stimulation  Type of Nipple: Flat  Comfort (Breast/Nipple): Soft / non-tender  Hold (Positioning): Assistance needed to correctly position infant at breast and maintain latch.  LATCH Score: 6  Mom has flat nipples bilaterally that do not appear to evert more with stimulation or hand expression. Baby has shallow latch without support of nipple shield and mom in pain with pinched nipple. Nipple shield applied; baby latched well, adjusted into a rhythmic suckling pattern, occasional swallow but need continuous stimulation to remain awake.  Lactation Tools Discussed/Used Tools: Nipple Shields Nipple shield size: 20  Nipple shield previously given by transition RN due to flat nipples/difficult latch.  Interventions Interventions: Breast feeding basics reviewed;Assisted with latch;Hand express;Reverse pressure;Breast compression;Adjust position;Education (nipple shield use, normal newborn feeding patterns/behaviors, early cues, position, alignment, keeping baby awake at the breast, output expectations)  Discharge    Consult Status Consult Status: Follow-up  Whiteboard updated with [facility] name/contact information.  Danford Bad 07/31/2022, 10:36 AM

## 2022-07-31 NOTE — Progress Notes (Signed)
Called to see patient due to postpartum hemorrhage, suspected 3rd degree laceration, and suspected cervical laceration.  Due to blood loss the patient had already received pitocin, TXA, and misoprostol (per rectum).  Her vital signs were stable with mild tachycardia (HR 120s).  She was mentating well and was responsive. The CNM was holding pressure on the cervix.  The cervix was inspected and while not bleeding there was a small (1.5-2 cm) laceration, which was repaired using 0 Vicryl in a running-locked fashion. This was hemostatic after repair.  Attention was turned to the anal area and rectum. The anus was still intact as was the rectum. There was a mild tear in the anal sphincter capsule. This tissue was re-inforced using 0 Vicryl in a single knot.  The rest of the second degree laceration was repaired using 2-0 Vicryl in the standard fashion. The tear starting along the right vaginal sulcus and extended down to the anal sphincter.  Once repaired this was noted to be hemostatic and well approximated.   With fundal massage, the patient had the expected amount of lochia.  We will continue to monitor her bleeding and support with fluids/blood products, as indicated. See CNM note for estimated or quantified blood loss.  When I entered the room the EBL was about 1,000 mL. She had an additional gush of blood that appeared to be about 300-500 mL total with some clotting.  So, the total was about 1,300-1,500 mL using the estimation obtained prior to my arrival.  Explained my findings and what I did to the patient. All questions answered. She is stable at this time with her last BP around 120s/70s and pulse in the 120s.    The midwife was present and assisted with retraction during the procedure. There was a female chaperone in the room at all times.  Thomasene Mohair, MD, [facility] [place] [facility] 07/31/2022 2:29 AM

## 2022-07-31 NOTE — Progress Notes (Signed)
Post Partum Day 0 Subjective: Doing well, no complaints.  Tolerating regular diet, pain with PO meds; has not been OOB yet since delivery.   No CP SOB Fever,Chills, N/V or leg pain; denies nipple or breast pain, no HA change of vision, RUQ/epigastric pain  Objective: BP 125/83   Pulse (!) 111   Temp 99.1 F (37.3 C) (Oral)   Resp 20   Ht 5\' 4"  (1.626 m)   Wt 85.7 kg   SpO2 96%   Breastfeeding Unknown   BMI 32.44 kg/m    Vitals:   07/31/22 0123 07/31/22 0145 07/31/22 0238 07/31/22 0242  BP: 122/70 123/73 125/85 111/86   07/31/22 0253 07/31/22 0324 07/31/22 0338 07/31/22 0353  BP: 119/78 116/75 119/75 125/75   07/31/22 0408 07/31/22 0423 07/31/22 0438 07/31/22 0452  BP: 111/70 117/74 124/83 125/83    Physical Exam:  General: NAD Breasts: soft/nontender CV: RRR Pulm: nl effort, CTABL Abdomen: soft, NT, BS x 4 Perineum:  moderate edema, laceration repair well approximated Lochia: moderate Uterine Fundus: fundus firm and 1 fb below umbilicus DVT Evaluation: no cords, ttp LEs   Recent Labs    07/30/22 1754 07/31/22 0021 07/31/22 0541  HGB 13.1 11.7* 10.8*  HCT 38.2 34.6* 31.1*  WBC 12.2* 16.9*  --   PLT 329 390  395  --     Assessment/Plan: 20 y.o. G1P1001 postpartum day # 0  - Continue routine PP care: Foley cath DC now, keep ice packs on perineum for edema.  - Acute blood loss anemia - s/p transufsion 1 u PRBC and venofer x 1 dose,  start po ferrous sulfate BID with stool softeners      Disposition: Does not desire Dc home today.     08/02/22, CNM 07/31/2022  10:07 AM

## 2022-07-31 NOTE — Progress Notes (Signed)
Labor Progress Note  Yvette Tran is a 21 y.o. G1P0 at [redacted]w[redacted]d by LMP admitted for active labor  Subjective: She is pushing well with contractions  Objective: BP 123/73   Pulse (!) 134   Temp 99.2 F (37.3 C) (Oral)   Resp (!) 22   Ht 5\' 4"  (1.626 m)   Wt 85.7 kg   SpO2 100%   BMI 32.44 kg/m  Notable VS details: reviewed  Fetal Assessment: FHT:  FHR: 135 bpm, variability: moderate,  accelerations:  Present,  decelerations:  Absent Category/reactivity:  Category I UC:   regular, every 3-4 minutes SVE:    Dilation: 10cm  Effacement: 100%  Station:  +2  Consistency: soft  Position: anterior  Membrane status:AROM @ 2040 Amniotic color: clear  Labs: Lab Results  Component Value Date   WBC 16.9 (H) 07/31/2022   HGB 11.7 (L) 07/31/2022   HCT 34.6 (L) 07/31/2022   MCV 82.6 07/31/2022   PLT 390 07/31/2022   PLT 395 07/31/2022    Assessment / Plan: Spontaneous labor, progressing normally  Labor:  Second stage, pushing for 45 minutes with good effort and noticeable yet slow fetal descent. Will continue pushing. Preeclampsia:  no signs or symptoms of toxicity Fetal Wellbeing:  Category I Pain Control:  Epidural I/D:   GBS negative, SROM x 2.5hours Anticipated MOD:  NSVD  08/02/2022, CNM 07/31/2022, 2:40 AM

## 2022-08-01 DIAGNOSIS — D62 Acute posthemorrhagic anemia: Secondary | ICD-10-CM | POA: Diagnosis not present

## 2022-08-01 LAB — CBC
HCT: 23.4 % — ABNORMAL LOW (ref 36.0–46.0)
HCT: 30.9 % — ABNORMAL LOW (ref 36.0–46.0)
Hemoglobin: 8 g/dL — ABNORMAL LOW (ref 12.0–15.0)
Hemoglobin: 10.4 g/dL — ABNORMAL LOW (ref 12.0–15.0)
MCH: 28.7 pg (ref 26.0–34.0)
MCH: 28.1 pg (ref 26.0–34.0)
MCHC: 34.2 g/dL (ref 30.0–36.0)
MCHC: 33.7 g/dL (ref 30.0–36.0)
MCV: 83.9 fL (ref 80.0–100.0)
MCV: 83.5 fL (ref 80.0–100.0)
Platelets: 234 10*3/uL (ref 150–400)
Platelets: 280 10*3/uL (ref 150–400)
RBC: 2.79 MIL/uL — ABNORMAL LOW (ref 3.87–5.11)
RBC: 3.7 MIL/uL — ABNORMAL LOW (ref 3.87–5.11)
RDW: 14 % (ref 11.5–15.5)
RDW: 14.7 % (ref 11.5–15.5)
WBC: 11.3 10*3/uL — ABNORMAL HIGH (ref 4.0–10.5)
WBC: 14.3 10*3/uL — ABNORMAL HIGH (ref 4.0–10.5)
nRBC: 0 % (ref 0.0–0.2)
nRBC: 0 % (ref 0.0–0.2)

## 2022-08-01 LAB — PREPARE RBC (CROSSMATCH)

## 2022-08-01 MED ORDER — SODIUM CHLORIDE 0.9% IV SOLUTION
Freq: Once | INTRAVENOUS | Status: AC
  Administered 2022-08-01: 10 mL/h via INTRAVENOUS

## 2022-08-01 NOTE — Progress Notes (Signed)
Postpartum Day  1  Subjective: voiding, tolerating PO, and reports SOB when ambulating. States her chest feels heavy when she goes to get up out of bed, like she can't catch her breath.  Ambulating with assist, pain managed with PO meds, tolerating regular diet, and voiding without difficulty.   No fever/chills, chest pain, nausea/vomiting, or leg pain. No nipple or breast pain. No headache, visual changes, or RUQ/epigastric pain.  Objective: BP 113/70   Pulse 82   Temp 98.1 F (36.7 C) (Oral)   Resp 20   Ht 5\' 4"  (1.626 m)   Wt 85.7 kg   SpO2 98%   Breastfeeding Unknown   BMI 32.44 kg/m    Physical Exam:  General: alert, cooperative, no distress, and pale Breasts: soft/nontender CV: RRR Pulm: nl effort, CTABL Abdomen: soft, non-tender, active bowel sounds Uterine Fundus: firm Perineum: minimal edema, repair well approximated Lochia: appropriate DVT Evaluation: No evidence of DVT seen on physical exam.  Recent Labs    07/31/22 1006 08/01/22 0730  HGB 9.5* 8.0*  HCT 27.1* 23.4*  WBC 15.6* 11.3*  PLT 246 234    Assessment/Plan: 21 y.o. G1P1001 postpartum day # 1  -Continue routine postpartum care -Lactation consult PRN for breastfeeding  -Acute blood loss anemia - received 1 unit pRBC and 1 dose of Venofer.  Symptomatic with ambulation, reporting SOB and chest heaviness.   -Will transfuse 1 unit of pRBC this AM  -Repeat CBC 4 hours after transfusion is completed  -Pulse ox with ambulation while symptomatic  -Discussed plan of care with Dr. 08/03/22 and updated on patient's concern and assessment    Disposition: Continue inpatient postpartum care    LOS: 2 days   Yvette Tran, CNM 08/01/2022, 8:44 AM   ----- 08/03/2022  Certified Nurse Midwife [facility]

## 2022-08-01 NOTE — Anesthesia Postprocedure Evaluation (Signed)
Anesthesia Post Note  Patient: Yvette Tran  Procedure(s) Performed: AN AD HOC LABOR EPIDURAL  Patient location during evaluation: Mother Baby Anesthesia Type: Epidural Level of consciousness: awake Pain management: pain level controlled Respiratory status: spontaneous breathing Postop Assessment: no headache Anesthetic complications: no   No notable events documented.   Last Vitals:  Vitals:   07/31/22 1940 07/31/22 2340  BP: 108/61 111/69  Pulse: 91 90  Resp: 20 20  Temp: 36.9 C 36.7 C  SpO2: 99% 98%    Last Pain:  Vitals:   08/01/22 0022  TempSrc:   PainSc: 0-No pain                 Jaye Beagle

## 2022-08-02 LAB — CBC
HCT: 28 % — ABNORMAL LOW (ref 36.0–46.0)
Hemoglobin: 9.5 g/dL — ABNORMAL LOW (ref 12.0–15.0)
MCH: 28.4 pg (ref 26.0–34.0)
MCHC: 33.9 g/dL (ref 30.0–36.0)
MCV: 83.6 fL (ref 80.0–100.0)
Platelets: 262 10*3/uL (ref 150–400)
RBC: 3.35 MIL/uL — ABNORMAL LOW (ref 3.87–5.11)
RDW: 14.7 % (ref 11.5–15.5)
WBC: 11.8 10*3/uL — ABNORMAL HIGH (ref 4.0–10.5)
nRBC: 0 % (ref 0.0–0.2)

## 2022-08-02 LAB — TYPE AND SCREEN
ABO/RH(D): O POS
Antibody Screen: NEGATIVE
Unit division: 0
Unit division: 0

## 2022-08-02 LAB — BPAM RBC
Blood Product Expiration Date: 202309142359
Blood Product Expiration Date: 202309142359
ISSUE DATE / TIME: 202308140120
ISSUE DATE / TIME: 202308151155
ISSUING PHYSICIAN: NEGATIVE
ISSUING PHYSICIAN: NEGATIVE
Unit Type and Rh: 5100
Unit Type and Rh: 5100

## 2022-08-02 MED ORDER — IBUPROFEN 600 MG PO TABS
600.0000 mg | ORAL_TABLET | Freq: Four times a day (QID) | ORAL | 0 refills | Status: DC
Start: 2022-08-02 — End: 2024-02-07

## 2022-08-02 MED ORDER — SIMETHICONE 80 MG PO CHEW
80.0000 mg | CHEWABLE_TABLET | ORAL | 0 refills | Status: DC | PRN
Start: 2022-08-02 — End: 2024-02-07

## 2022-08-02 MED ORDER — WITCH HAZEL-GLYCERIN EX PADS: 1.0000 | MEDICATED_PAD | CUTANEOUS | 12 refills | Status: DC | PRN

## 2022-08-02 MED ORDER — COCONUT OIL OIL: 1.0000 | TOPICAL_OIL | 0 refills | Status: DC | PRN

## 2022-08-02 MED ORDER — DOCUSATE SODIUM 100 MG PO CAPS: 100.0000 mg | ORAL_CAPSULE | Freq: Two times a day (BID) | ORAL | 0 refills | Status: DC

## 2022-08-02 MED ORDER — SENNOSIDES-DOCUSATE SODIUM 8.6-50 MG PO TABS: 2.0000 | ORAL_TABLET | Freq: Every day | ORAL | Status: DC

## 2022-08-02 MED ORDER — DIBUCAINE (PERIANAL) 1 % EX OINT: 1.0000 | TOPICAL_OINTMENT | CUTANEOUS | Status: DC | PRN

## 2022-08-02 MED ORDER — BENZOCAINE-MENTHOL 20-0.5 % EX AERO: 1.0000 | INHALATION_SPRAY | CUTANEOUS | Status: DC | PRN

## 2022-08-02 MED ORDER — ACETAMINOPHEN 325 MG PO TABS: 650.0000 mg | ORAL_TABLET | ORAL | Status: DC | PRN

## 2022-08-02 MED ORDER — FERROUS SULFATE 325 (65 FE) MG PO TABS: 325.0000 mg | ORAL_TABLET | Freq: Two times a day (BID) | ORAL | 3 refills | Status: DC

## 2022-08-02 NOTE — Lactation Note (Signed)
This note was copied from a baby's chart. Lactation Consultation Note  Patient Name: Boy [place] Boyden Today's Date: 08/02/2022 Reason for consult: Follow-up assessment;Primapara;Term Age:23 hours  Maternal Data Has patient been taught Hand Expression?: Yes Does the patient have breastfeeding experience prior to this delivery?: No  Feeding Mother's Current Feeding Choice: Breast Milk  Baby has been feeding well. 5% below birth weight, appropriate wet/stool diapers for HOL, no pain/discomfort noted with feedings and mom has been independent.  LATCH Score    Lactation Tools Discussed/Used Tools: Nipple Shields Nipple shield size: 20  Interventions Interventions: Breast feeding basics reviewed;Hand express;Pre-pump if needed;DEBP;Ice;Education  Educated on milk supply and demand and normal course of lactation. Reviewed signs and how to tell that baby is getting enough, encouraged to continue tracking the output.  Discharge Discharge Education: Engorgement and breast care;Warning signs for feeding baby;Outpatient recommendation Pump: Personal  Reviewed anticipated breast changes, management of breast fullness and engorgement and nipple care.  Outpatient lactation contact information given; mom encouraged to call with questions and ongoing support as needed.  Consult Status Consult Status: Complete    Danford Bad 08/02/2022, 10:19 AM

## 2022-08-02 NOTE — Addendum Note (Signed)
Addendum  created 08/02/22 0921 by Junious Silk, CRNA   Clinical Note Signed

## 2022-08-02 NOTE — Anesthesia Post-op Follow-up Note (Signed)
  Anesthesia Pain Follow-up Note  Patient: [name]  Day #: 1  Date of Follow-up: 08/02/2022 Time: 0850  Last Vitals:  Vitals:   08/02/22 0100 08/02/22 0743  BP:  113/71  Pulse: 97 88  Resp:  20  Temp:  36.9 C  SpO2: 100% 100%    Level of Consciousness: alert patient stood in place without pain.  C/o mild muscle pain lower back post partum muscle pain from vaginal delivery.  No parathesias noted.    Pain: mild   Side Effects:None  Catheter Site Exam:clean, dry, no drainage     Plan: Continue rest and NSAIDS for muscle pain secondary to labor  Saquoia Sianez,  Sheran Fava

## 2022-08-28 ENCOUNTER — Ambulatory Visit: Payer: Self-pay

## 2022-08-28 NOTE — Lactation Note (Signed)
This note was copied from a baby's chart. Lactation Consultation Note  Patient Name: Yvette Tran Today's Date: 08/28/2022 Reason for consult: Other (Comment) (outpatient) Age:23 wk.o.  Outpatient lactation consult appointment made per mother's request. Baby currently using nipple shield (given while in hospital due to short/flat nipples). Mom wants to know if possible to wean from shield, and has questions re: pump implementation, milk storage, and bottle feeding.  Maternal Data Does the patient have breastfeeding experience prior to this delivery?: No  Feeding Mother's Current Feeding Choice: Breast Milk  Baby has been feeding every 3 hours during the day and every 4 hours at night. Mom reports currently limiting feedings to approximately 10 minutes per side. Education given on the fore and hind milk, and the importance of completely emptying one breast before offering the second to ensure adequate intake of the hind milk to continue promote adequate weight gain/nutritional intake.  LC observed feeding: 1m on L breast- after 10 minutes LC did attempt to pull baby from breast and re-latch without the shield. Even with suction and feeding for 10 minutes nipples do still appear short; breast was sandwiched and baby did latch- mom in extreme discomfort and nipple was pinched when baby did come off. After 2 attempts, nipple shield was replaced and baby resumed feeding for additional 11 minutes without problems after diaper change (wet/stool diaper). Baby content off of one side.  LATCH Score Latch: Grasps breast easily, tongue down, lips flanged, rhythmical sucking.  Audible Swallowing: Spontaneous and intermittent  Type of Nipple: Flat (short even with stimulation)  Comfort (Breast/Nipple): Soft / non-tender  Hold (Positioning): No assistance needed to correctly position infant at breast.  LATCH Score: 9   Lactation Tools Discussed/Used Tools: Pump;Nipple Shields Nipple  shield size: 20 Breast pump type: Double-Electric Breast Pump (Spectra; her personal pump) Pump Education: Setup, frequency, and cleaning;Milk Storage Reason for Pumping: wanted to learn how to use the pump Pumping frequency: as needed Pumped volume: 120 mL (4oz)  Mom used pump for the first time (Spectra) on the R breast that baby did not feed off of. We reviewed the buttons of the pump- purpose behind the cycles and suction levels, and comfortability. Mom pumped for full time of 15 minutes and received 4 oz.  Interventions Interventions: Breast feeding basics reviewed;DEBP;Education;Pace feeding (attempts to latch without shield; tips given moving forward, pump introduction, occasional use, milk expression/storage, and demonstrated pace bottle feeding)  Discharge Pump: Personal (Spectra)  Consult Status Consult Status: Complete  Over all, we discussed mom's preference for feeding with/without the shield. LC did not observe any improvement of the nipple with "switch and bait" of beginning feed with shield and then removing it, nor any chance in the nipple with the use of the pump.  Currently do not feel that baby would latch well/comfortably for mom without the shield.   Tips given on how to move forward with working on removal of the nipple shield if they decide that is what they want to explore- also the amount of time that this process could take.  Parents wanted guidance on pump use/introduction, milk storage, and bottle introduction. All guidance given/tips given, education provided, assisted with pumping in office; mom expressed 4oz. Provided routine of feeding on demand, emptying breast before switching- pumping opposite breast if baby does not eat for milk storage purposes and/or use while away from baby.  Danford Bad 08/28/2022, 3:38 PM

## 2022-09-25 ENCOUNTER — Telehealth: Payer: Self-pay

## 2022-09-26 ENCOUNTER — Ambulatory Visit: Payer: BC Managed Care – PPO | Attending: Certified Nurse Midwife

## 2022-09-26 DIAGNOSIS — M6281 Muscle weakness (generalized): Secondary | ICD-10-CM | POA: Insufficient documentation

## 2022-09-26 DIAGNOSIS — R278 Other lack of coordination: Secondary | ICD-10-CM | POA: Insufficient documentation

## 2022-09-26 DIAGNOSIS — M6289 Other specified disorders of muscle: Secondary | ICD-10-CM | POA: Insufficient documentation

## 2022-09-26 NOTE — Therapy (Signed)
OUTPATIENT PHYSICAL THERAPY FEMALE PELVIC EVALUATION   Patient Name: Yvette Tran MRN: [id number] DOB:[birth year], 21 y.o., female Today's Date: 09/26/2022   PT End of Session - 09/26/22 1143     Visit Number 1    Number of Visits 10    Date for PT Re-Evaluation 12/05/22    Authorization Type IE: 09/26/22    PT Start Time 1145    PT Stop Time 1225    PT Time Calculation (min) 40 min    Activity Tolerance Patient tolerated treatment well             History reviewed. No pertinent past medical history. Past Surgical History:  Procedure Laterality Date   APPENDECTOMY     Patient Active Problem List   Diagnosis Date Noted   Postpartum hemorrhage, delivered 08/01/2022   Acute blood loss anemia 08/01/2022   Uterine contractions 07/30/2022   Encounter for supervision of normal pregnancy in third trimester 01/26/2022    PCP: None per Pt  REFERRING PROVIDER: Janyce Llanos, CNM   REFERRING DIAG:  O70.20 (ICD-10-CM) - Third degree perineal laceration during delivery, unspecified   THERAPY DIAG:  Pelvic floor dysfunction  Other lack of coordination  Muscle weakness (generalized)  Rationale for Evaluation and Treatment: Rehabilitation  ONSET DATE: After birth in August   RED FLAGS: N/A  Have you had any night sweats? Unexplained weight loss? Saddle anesthesia? Unexplained changes in bowel or bladder habits?   SUBJECTIVE: Patient confirms identification and approves PT to assess pelvic floor and treatment Yes                                                                                                                                                                                           PRECAUTIONS: None  WEIGHT BEARING RESTRICTIONS: No  FALLS:  Has patient fallen in last 6 months? No  OCCUPATION/SOCIAL ACTIVITIES: Staying at home with 92 month old, walking, hiking trails, reading, baking/cooking, would like to return to gym   PLOF:  Independent   LIVING ENVIRONMENT: Lives with: lives with their spouse Lives in: House/apartment    CHIEF CONCERN: Pt was recommended to PFPT. Pt has not returned to sexual activity and currently has hemorrhoids. Pt feels discomfort/pain initially when having a BM.    PAIN:  Are you having pain? No    PATIENT GOALS: Pt would like not to have bowel leakage, be more confident and educational about her body postpartum    UROLOGICAL HISTORY  Fluid intake: water, Body Armour, smoothies   Pain with urination: No Fully empty bladder: No Stream: Strong Urgency: No Frequency: every 3 hrs  Nocturia: 0x -  but hard to determine due to breastfeeding during the night  Leakage:  None Pads: No   GASTROINTESTINAL HISTORY Pain with bowel movement: Yes Type of bowel movement:Type (Bristol Stool Scale) - on average Type 4 and Strain No Frequency: 1x/day  Fully empty rectum: No Leakage: Yes, 3-4x/week having to change underwear    SEXUAL HISTORY/FUNCTION Pain with intercourse: During Penetration, After Intercourse, and Pain Interrupts Intercourse Ability to have vaginal penetration:  Yes: has not tried since giving birth Able to achieve orgasm?: Yes  OBSTETRICAL HISTORY Vaginal deliveries: G1P1 Tearing: Yes: Grade 3 tear  C-section deliveries: none Currently pregnant: No  GYNECOLOGICAL HISTORY Hysterectomy: no Pelvic Organ Prolapse: None Pain with exam: - has not had Papsmear done  Heaviness/pressure: no   OBJECTIVE:    COGNITION: Overall cognitive status: Within functional limits for tasks assessed     POSTURE:  Lumbar lordosis:   Thoracic kyphosis: Deferred 2/2 time constraints  Iliac crest height:  Lumbar lateral shift:  Pelvic obliquity:  Leg length discrepancy:   GAIT: Deferred 2/2 time constraints  Distance walked:  Comments:   Trendelenburg:   SENSATION: Deferred 2/2 time constraints  Light touch: , L2-S2 dermatomes  Proprioception:    RANGE OF  MOTION:  Deferred 2/2 time constraints   (Norm range in degrees)  LEFT  RIGHT   Lumbar forward flexion (65):      Lumbar extension (30):     Lumbar lateral flexion (25):     Thoracic and Lumbar rotation (30 degrees):       Hip Flexion (0-125):      Hip IR (0-45):     Hip ER (0-45):     Hip Adduction:      Hip Abduction (0-40):     Hip extension (0-15):     (*= pain, Blank rows = not tested)   STRENGTH: MMT  Deferred 2/2 time constraints   RLE  LLE   Hip Flexion    Hip Extension    Hip Abduction     Hip Adduction     Hip ER     Hip IR     Knee Extension    Knee Flexion    Dorsiflexion     Plantarflexion (seated)    (*= pain, Blank rows = not tested)   SPECIAL TESTS: Deferred 2/2 time constraints  Centralization and Peripheralization (SN 92, -LR 0.12):  Slump (SN 83, -LR 0.32):  SLR (SN 92, -LR 0.29): R: Lumbar quadrant (SN 70): R:  FABER (SN 81): FADIR (SN 94):  Hip scour (SN 50):  Thigh Thrust (SN 88, -LR 0.18) : Distraction (IR67):  Compression (SN/SP 69): Stork/March (SP 93):  PALPATION: Deferred 2/2 time constraints  Abdominal:  Diastasis:  finger above umbilicus,  fingers at and below umbilicus  Scar mobility: present/mobile perpendicular, parallel Rib flare: present/absent  EXTERNAL PELVIC EXAM: Patient educated on the purpose of the pelvic exam and articulated understanding; patient consented to the exam verbally. Palpation: Breath coordination: present/absent/inconsistent Voluntary Contraction: present/absent Relaxation: full/delayed/non-relaxing Perineal movement with sustained IAP increase ("bear down"): descent/no change/elevation/excessive descent Perineal movement with rapid IAP increase ("cough"): elevation/no change/descent Pubic symphysis: (0= no contraction, 1= flicker, 2= weak squeeze, 3= fair squeeze with lift, 4= good squeeze and lift against resistance, 5= strong squeeze against strong resistance)   INTERNAL PELVIC EXAM: Patient  educated on the purpose of the pelvic exam and articulated understanding; patient consented to the exam verbally. Deferred 2/2 to time constraints Introitus Appears:  Skin integrity:  Scar mobility: Strength (  PERF):  Symmetry: Palpation: Prolapse: (0= no contraction, 1= flicker, 2= weak squeeze, 3= fair squeeze with lift, 4= good squeeze and lift against resistance, 5= strong squeeze against strong resistance)    Patient Education:  Patient educated on what to expect during course of physical therapy, POC, and provided with HEP including: toileting posture handout and just to read scar mobilization. Patient verbalized understanding and returned demonstration. Patient will benefit from further education in order to maximize compliance and understanding for long-term therapeutic gains.   Patient Surveys:  FOTO Urinary Problem - 75  FOTO Bowel Leakage - 55     ASSESSMENT:  Clinical Impression: Patient is a 21 y.o. who was seen today for physical therapy evaluation and treatment for a chief concern of perineal tearing postpartum and bowel leakage. Today's evaluation suggest deficits in IAP management, PFM coordination, PFM strength, PFM extensibility, pain, and scar mobility as evidenced by Grade 3 perineal tearing (07/31/22), feeling of incomplete emptying of bowel/bladder, bowel leakage after toileting and occasionally with flatulation, requiring up to 4x of changing undergarments per week due to bowel leakage, pain with penetrative sex and unable to complete a Papsmear due to increased pain. Patient's responses on FOTO Urinary Problem (75) indicates minimal limitation/disability/distress; however, responses on FOTO Bowel Leakage (55) indicated moderate limitation/disability/distress. Patient's progress may be limited due to demanding nature of taking care of a newborn; however, patient's motivation is advantageous. Pt with basic understanding of PFM function in bowel/bladder habits, sexual  function, deep core, and posture. Patient will benefit from skilled therapeutic intervention to address deficits in  IAP management, PFM coordination, PFM strength, PFM extensibility, pain, and scar mobility in order to increase PLOF and improve overall QOL.    Objective Impairments: decreased coordination, decreased strength, increased fascial restrictions, improper body mechanics, postural dysfunction, and pain.   Activity Limitations: carrying, lifting, squatting, continence, toileting, and caring for others  Personal Factors: 1 comorbidity: postpartum depression  are also affecting patient's functional outcome.   Rehab Potential: Good  Clinical Decision Making: Evolving/moderate complexity  Evaluation Complexity: Moderate   GOALS: Goals reviewed with patient? Yes  SHORT TERM GOALS: Target date: 10/31/2022  Patient will demonstrate independence with HEP in order to maximize therapeutic gains and improve carryover from physical therapy sessions to ADLs in the home and community. Baseline: toileting posture handout/scar mobilization handout Goal status: INITIAL    LONG TERM GOALS: Target date: 12/05/2022   Patient will demonstrate circumferential and sequential contraction of >3/5 MMT, > 5 sec hold x5 and 5 consecutive quick flicks with </= 10 min rest between testing bouts, and relaxation of the PFM coordinated with breath for improved management of intra-abdominal pressure and normal bowel and bladder function without the presence of pain nor incontinence in order to improve participation at home and in the community. Baseline: will assess next session  Goal status: INITIAL  2.  Patient will demonstrate coordinated lengthening and relaxation of PFM with diaphragmatic inhalation in order to decrease spasm and allow for unrestricted elimination of urine/feces for improved overall QOL. Baseline: has hemorrhoids and pain initially with a BM/toileting posture reviewed Goal status:  INITIAL  3.  Patient will report decreased frequency of having to change undergarments as indicated by a 24 hour period to demonstrate improved bowel control/improved PFM coordination and allow for increased participation in activities outside of the home. Baseline: 3-4x/week changing underwear due to bowel leakage Goal status: INITIAL  4.  Patient will report being able to return to activities including, but not  limited to: penetrative sex, ability to participate in annual GYN exams/Papsmear, hiking/increased physical activity without pain or limitation to indicate complete resolution of one of the Pt's concerns and return to prior level of participation at home and in the community. Baseline: before pregnancy having pain with penetrative sex and unable to follow through with Papsmear due to pain, is walking now but has not returned to gym Goal status: INITIAL  5.  Patient will score >/= 83 and 69 on FOTO Urinary Problem and Bowel Leakage  in order to demonstrate improved IAP management, improved PFM coordination, and overall QOL.  Baseline: 75 and 55 Goal status: INITIAL    PLAN: PT Frequency: 1x/week  PT Duration: 10 weeks  Planned Interventions: Therapeutic exercises, Therapeutic activity, Neuromuscular re-education, Balance training, Gait training, Patient/Family education, Self Care, Joint mobilization, Spinal mobilization, Cryotherapy, Moist heat, scar mobilization, Taping, and Manual therapy  Plan For Next Session: phy assess, go over scar    Jaedin Regina, PT, DPT  09/26/2022, 12:39 PM

## 2022-10-02 ENCOUNTER — Ambulatory Visit: Payer: BC Managed Care – PPO

## 2022-10-02 DIAGNOSIS — M6289 Other specified disorders of muscle: Secondary | ICD-10-CM

## 2022-10-02 DIAGNOSIS — R278 Other lack of coordination: Secondary | ICD-10-CM

## 2022-10-02 DIAGNOSIS — M6281 Muscle weakness (generalized): Secondary | ICD-10-CM

## 2022-10-02 NOTE — Therapy (Signed)
OUTPATIENT PHYSICAL THERAPY FEMALE PELVIC TREATMENT   Patient Name: Yvette Tran MRN: [id number] DOB:Mar 02, 2001, 21 y.o., female Today's Date: 10/02/2022   PT End of Session - 10/02/22 1102     Visit Number 2    Number of Visits 10    Date for PT Re-Evaluation 12/05/22    Authorization Type IE: 09/26/22    PT Start Time 1100    PT Stop Time 1140    PT Time Calculation (min) 40 min    Activity Tolerance Patient tolerated treatment well             History reviewed. No pertinent past medical history. Past Surgical History:  Procedure Laterality Date   APPENDECTOMY     Patient Active Problem List   Diagnosis Date Noted   Postpartum hemorrhage, delivered 08/01/2022   Acute blood loss anemia 08/01/2022   Uterine contractions 07/30/2022   Encounter for supervision of normal pregnancy in third trimester 01/26/2022    PCP: None per Pt  REFERRING PROVIDER: Janyce Llanos, CNM   REFERRING DIAG:  O70.20 (ICD-10-CM) - Third degree perineal laceration during delivery, unspecified   THERAPY DIAG:  Pelvic floor dysfunction  Other lack of coordination  Muscle weakness (generalized)  Rationale for Evaluation and Treatment: Rehabilitation  ONSET DATE: After birth in August                                                                                                                                                                                          PRECAUTIONS: None  WEIGHT BEARING RESTRICTIONS: No  FALLS:  Has patient fallen in last 6 months? No  OCCUPATION/SOCIAL ACTIVITIES: Staying at home with 53 month old, walking, hiking trails, reading, baking/cooking, would like to return to gym   PLOF: Independent   CHIEF CONCERN: Pt was recommended to PFPT. Pt has not returned to sexual activity and currently has hemorrhoids. Pt feels discomfort/pain initially when having a BM.    PATIENT GOALS: Pt would like not to have bowel leakage, be more confident  and educational about her body postpartum    UROLOGICAL HISTORY  Fluid intake: water, Body Armour, smoothies   Pain with urination: No Fully empty bladder: No Stream: Strong Urgency: No Frequency: every 3 hrs Nocturia: 0x - but hard to determine due to breastfeeding during the night  Leakage:  None Pads: No   GASTROINTESTINAL HISTORY Pain with bowel movement: Yes Type of bowel movement:Type (Bristol Stool Scale) - on average Type 4 and Strain No Frequency: 1x/day  Fully empty rectum: No Leakage: Yes, 3-4x/week having to change underwear    SEXUAL HISTORY/FUNCTION Pain with intercourse: During Penetration, After  Intercourse, and Pain Interrupts Intercourse Ability to have vaginal penetration:  Yes: has not tried since giving birth Able to achieve orgasm?: Yes  OBSTETRICAL HISTORY Vaginal deliveries: G1P1 Tearing: Yes: Grade 3 tear  C-section deliveries: none Currently pregnant: No  GYNECOLOGICAL HISTORY Hysterectomy: no Pelvic Organ Prolapse: None Pain with exam: - has not had Papsmear done  Heaviness/pressure: no  SUBJECTIVE:  Pt has no changes from IE. Pt has noticed a little bit of urinary frequency than she thought.   PAIN:  Are you having pain? No   TODAY'S TREATMENT  Neuromuscular Re-education:  Pre-treatment assessment:  OBJECTIVE:    COGNITION: Overall cognitive status: Within functional limits for tasks assessed     POSTURE:   Iliac crest height: L iliac crest, L shoulder elevated Pelvic obliquity: WNL   RANGE OF MOTION:   (Norm range in degrees)  LEFT 10/02/22 RIGHT 10/02/22  Lumbar forward flexion (65):  WNL    Lumbar extension (30): WNL    Lumbar lateral flexion (25):  WNL WNL  Thoracic and Lumbar rotation (30 degrees):    WNL Restricted  Hip Flexion (0-125):   WNL WNL  Hip IR (0-45):  WNL WNL  Hip ER (0-45):  WNL WNL  Hip Adduction:      Hip Abduction (0-40):  WNL WNL  Hip extension (0-15):     (*= pain, Blank rows = not  tested)   STRENGTH: MMT    RLE 10/02/22 LLE 10/02/22  Hip Flexion 4 4  Hip Extension 5 5  Hip Abduction     Hip Adduction     Hip ER  5 5  Hip IR  5 5  Knee Extension 5 5  Knee Flexion 5 5  Dorsiflexion     Plantarflexion (seated) 5 5  (*= pain, Blank rows = not tested)   SPECIAL TESTS:   FABER (SN 81): negative B FADIR (SN 94): negative B   PALPATION:  Abdominal:  Diastasis: none Scar mobility: present, at perineum - not observed but subjective  Rib flare: none  -R middle of abdomen with increased tenderness upon palpation   EXTERNAL PELVIC EXAM: Patient educated on the purpose of the pelvic exam and articulated understanding; patient consented to the exam verbally. Breath coordination: present but inconsistent Voluntary Contraction: present, 3/5 MMT Relaxation: full Perineal movement with sustained IAP increase ("bear down"): descent but with Valsalva maneuver  Perineal movement with rapid IAP increase ("cough"): no change (0= no contraction, 1= flicker, 2= weak squeeze, 3= fair squeeze with lift, 4= good squeeze and lift against resistance, 5= strong squeeze against strong resistance)    Neuromuscular Re-education: Discussion on filling out bladder diary and how-to. Pt verbalized understanding.   Discussion and demonstration on log roll technique for improved IAP management and to decrease any further complications of pelvic organ prolapse.   Discussion and video demonstration of relationahip between diaphragm and pelvic floor muscles. Pt verbalized understanding.   Supine hooklying diaphragmatic breathing with VCs and TCs for downregulation of the nervous system and improved management of IAP  Seated mermaid pose (QL), on R side, for improved tissue extensibility and pain modulation   Patient response to interventions: Pt able to differentiate between "belly breathing" and "chest breathing"   Patient Education:  Patient provided with HEP including: fill  out bladder diary, supine breathing, mermaid pose. Patient educated throughout session on appropriate technique and form using multi-modal cueing, HEP, and activity modification. Patient will benefit from further education in order to maximize compliance and  understanding for long-term therapeutic gains.     ASSESSMENT:  Clinical Impression: Patient presents to clinic with excellent motivation to participate in today's session. Upon physical examination, Pt demonstrates deficits in IAP management, PFM coordination, PFM strength, LE strength, ROM, posture, pain, and scar mobility as evidenced by Grade 3 perineal tearing (07/31/22), increased urinary frequency (every hour), L iliac crest elevation with L shoulder elevation, restricted ROM in R lateral flexion, 4/5 MMT with B hip flexion, tenderness/pain upon palpation of R middle of abdomen, present but inconsistent breath coordination, 3/5 MMT of PFM, and Valsalva maneuver observed during sustained IAP increase. Pt required moderate VCs and TCs for supine diaphragmatic breathing to avoid bodily compensations (increased chest excursion). Pt responded positively to active and educational interventions. Patient will benefit from skilled therapeutic intervention to address deficits in IAP management, PFM coordination, PFM strength, LE strength, ROM, posture, pain, and scar mobilityand scar mobility in order to increase PLOF and improve overall QOL.    Objective Impairments: decreased coordination, decreased strength, increased fascial restrictions, improper body mechanics, postural dysfunction, and pain.   Activity Limitations: carrying, lifting, squatting, continence, toileting, and caring for others  Personal Factors: 1 comorbidity: postpartum depression  are also affecting patient's functional outcome.   Rehab Potential: Good  Clinical Decision Making: Evolving/moderate complexity  Evaluation Complexity: Moderate   GOALS: Goals reviewed with  patient? Yes  SHORT TERM GOALS: Target date: 10/31/2022  Patient will demonstrate independence with HEP in order to maximize therapeutic gains and improve carryover from physical therapy sessions to ADLs in the home and community. Baseline: toileting posture handout/scar mobilization handout Goal status: INITIAL    LONG TERM GOALS: Target date: 12/05/2022   Patient will demonstrate circumferential and sequential contraction of >3/5 MMT, > 5 sec hold x5 and 5 consecutive quick flicks with </= 10 min rest between testing bouts, and relaxation of the PFM coordinated with breath for improved management of intra-abdominal pressure and normal bowel and bladder function without the presence of pain nor incontinence in order to improve participation at home and in the community. Baseline: 3/5 MMT/no endurance tested  Goal status: INITIAL  2.  Patient will demonstrate coordinated lengthening and relaxation of PFM with diaphragmatic inhalation in order to decrease spasm and allow for unrestricted elimination of urine/feces for improved overall QOL. Baseline: has hemorrhoids and pain initially with a BM/toileting posture reviewed, present but inconsistent breath Goal status: INITIAL  3.  Patient will report decreased frequency of having to change undergarments as indicated by a 24 hour period to demonstrate improved bowel control/improved PFM coordination and allow for increased participation in activities outside of the home. Baseline: 3-4x/week changing underwear due to bowel leakage Goal status: INITIAL  4.  Patient will report being able to return to activities including, but not limited to: penetrative sex, ability to participate in annual GYN exams/Papsmear, hiking/increased physical activity without pain or limitation to indicate complete resolution of one of the Pt's concerns and return to prior level of participation at home and in the community. Baseline: before pregnancy having pain with  penetrative sex and unable to follow through with Papsmear due to pain, is walking now but has not returned to gym Goal status: INITIAL  5.  Patient will score >/= 83 and 69 on FOTO Urinary Problem and Bowel Leakage  in order to demonstrate improved IAP management, improved PFM coordination, and overall QOL.  Baseline: 75 and 55 Goal status: INITIAL    PLAN: PT Frequency: 1x/week  PT Duration: 10 weeks  Planned Interventions: Therapeutic exercises, Therapeutic activity, Neuromuscular re-education, Balance training, Gait training, Patient/Family education, Self Care, Joint mobilization, Spinal mobilization, Cryotherapy, Moist heat, scar mobilization, Taping, and Manual therapy  Plan For Next Session: go over bladder diary, thoracic rotation, how was breathing?/deep core, how is leakage?   Anacleto Batterman, PT, DPT  10/02/2022, 11:02 AM

## 2022-10-09 ENCOUNTER — Ambulatory Visit: Payer: BC Managed Care – PPO

## 2022-10-09 DIAGNOSIS — R278 Other lack of coordination: Secondary | ICD-10-CM

## 2022-10-09 DIAGNOSIS — M6281 Muscle weakness (generalized): Secondary | ICD-10-CM

## 2022-10-09 DIAGNOSIS — M6289 Other specified disorders of muscle: Secondary | ICD-10-CM

## 2022-10-09 NOTE — Therapy (Signed)
OUTPATIENT PHYSICAL THERAPY FEMALE PELVIC TREATMENT   Patient Name: Yvette Tran MRN: [id number] DOB:2001/02/19, 21 y.o., female Today's Date: 10/09/2022   PT End of Session - 10/09/22 1020     Visit Number 3    Number of Visits 10    Date for PT Re-Evaluation 12/05/22    Authorization Type IE: 09/26/22    PT Start Time 1021    PT Stop Time 1055    PT Time Calculation (min) 34 min    Activity Tolerance Patient tolerated treatment well             History reviewed. No pertinent past medical history. Past Surgical History:  Procedure Laterality Date   APPENDECTOMY     Patient Active Problem List   Diagnosis Date Noted   Postpartum hemorrhage, delivered 08/01/2022   Acute blood loss anemia 08/01/2022   Uterine contractions 07/30/2022   Encounter for supervision of normal pregnancy in third trimester 01/26/2022    PCP: None per Pt  REFERRING PROVIDER: Janyce Llanos, CNM   REFERRING DIAG:  O70.20 (ICD-10-CM) - Third degree perineal laceration during delivery, unspecified   THERAPY DIAG:  Pelvic floor dysfunction  Other lack of coordination  Muscle weakness (generalized)  Rationale for Evaluation and Treatment: Rehabilitation  ONSET DATE: After birth in August                                                                                                                                                                                          PRECAUTIONS: None  WEIGHT BEARING RESTRICTIONS: No  FALLS:  Has patient fallen in last 6 months? No  OCCUPATION/SOCIAL ACTIVITIES: Staying at home with 30 month old, walking, hiking trails, reading, baking/cooking, would like to return to gym   PLOF: Independent   CHIEF CONCERN: Pt was recommended to PFPT. Pt has not returned to sexual activity and currently has hemorrhoids. Pt feels discomfort/pain initially when having a BM.    PATIENT GOALS: Pt would like not to have bowel leakage, be more confident  and educational about her body postpartum    UROLOGICAL HISTORY  Fluid intake: water, Body Armour, smoothies   Pain with urination: No Fully empty bladder: No Stream: Strong Urgency: No Frequency: every 3 hrs Nocturia: 0x - but hard to determine due to breastfeeding during the night  Leakage:  None Pads: No   GASTROINTESTINAL HISTORY Pain with bowel movement: Yes Type of bowel movement:Type (Bristol Stool Scale) - on average Type 4 and Strain No Frequency: 1x/day  Fully empty rectum: No Leakage: Yes, 3-4x/week having to change underwear    SEXUAL HISTORY/FUNCTION Pain with intercourse: During Penetration, After  Intercourse, and Pain Interrupts Intercourse Ability to have vaginal penetration:  Yes: has not tried since giving birth Able to achieve orgasm?: Yes  OBSTETRICAL HISTORY Vaginal deliveries: G1P1 Tearing: Yes: Grade 3 tear  C-section deliveries: none Currently pregnant: No  GYNECOLOGICAL HISTORY Hysterectomy: no Pelvic Organ Prolapse: None Pain with exam: - has not had Papsmear done  Heaviness/pressure: no  SUBJECTIVE:  Pt arrived 6 mins after scheduled time. Pt's son has been sick and has not been able to perform HEP as much as she should.    PAIN:  Are you having pain? No    OBJECTIVE:    COGNITION: Overall cognitive status: Within functional limits for tasks assessed     POSTURE:   Iliac crest height: L iliac crest, L shoulder elevated Pelvic obliquity: WNL   RANGE OF MOTION:   (Norm range in degrees)  LEFT 10/02/22 RIGHT 10/02/22  Lumbar forward flexion (65):  WNL    Lumbar extension (30): WNL    Lumbar lateral flexion (25):  WNL WNL  Thoracic and Lumbar rotation (30 degrees):    WNL Restricted  Hip Flexion (0-125):   WNL WNL  Hip IR (0-45):  WNL WNL  Hip ER (0-45):  WNL WNL  Hip Adduction:      Hip Abduction (0-40):  WNL WNL  Hip extension (0-15):     (*= pain, Blank rows = not tested)   STRENGTH: MMT    RLE 10/02/22  LLE 10/02/22  Hip Flexion 4 4  Hip Extension 5 5  Hip Abduction     Hip Adduction     Hip ER  5 5  Hip IR  5 5  Knee Extension 5 5  Knee Flexion 5 5  Dorsiflexion     Plantarflexion (seated) 5 5  (*= pain, Blank rows = not tested)   SPECIAL TESTS:   FABER (SN 81): negative B FADIR (SN 94): negative B   PALPATION:  Abdominal:  Diastasis: none Scar mobility: present, at perineum - not observed but subjective  Rib flare: none  -R middle of abdomen with increased tenderness upon palpation   EXTERNAL PELVIC EXAM: Patient educated on the purpose of the pelvic exam and articulated understanding; patient consented to the exam verbally. Breath coordination: present but inconsistent Voluntary Contraction: present, 3/5 MMT Relaxation: full Perineal movement with sustained IAP increase ("bear down"): descent but with Valsalva maneuver  Perineal movement with rapid IAP increase ("cough"): no change (0= no contraction, 1= flicker, 2= weak squeeze, 3= fair squeeze with lift, 4= good squeeze and lift against resistance, 5= strong squeeze against strong resistance)   TODAY'S TREATMENT  Neuromuscular Re-education: Discussion on urinary urgency after review of bladder diary. Pt with up to 11-12 visits to the bathroom to urinate and demonstrating "just in case" visits as well. Discussion on Bradley's loops and bladder-brain connection. Pt given educational handout.   Seated diaphragmatic breathing with VCs and TCs for downregulation of the nervous system and improved management of IAP  Discussion in depth with anatomical model about scar tissue mobilization at perineal scar. Discussion on using a water-based lubricant or coconut oil. Also, discussion on gradual progression of scar massage and to listen to body's response as well. Pt given Slipper Stuff lubricant to trial.   Discussion on demonstrating pelvic floor contractions next session to aid in slight fecal incontinence Pt is  experiencing.    Patient response to interventions: Pt excited to start scar mobilizations   Patient Education:  Patient provided with HEP including:  seated diaphragmatic breathing and urinary urgency handout. Patient educated throughout session on appropriate technique and form using multi-modal cueing, HEP, and activity modification. Patient will benefit from further education in order to maximize compliance and understanding for long-term therapeutic gains.     ASSESSMENT:  Clinical Impression: Patient presents to clinic with excellent motivation to participate in today's session. Pt continues to demonstrate deficits in IAP management, PFM coordination, PFM strength, LE strength, ROM, posture, pain, and scar mobility. Discussion on urinary urgency control via distraction techniques and diaphragmatic breathing. Pt verbalized understanding. Most of session consisted of discussion and demonstration on anatomical pelvic model scar mobilization techniques and how to gradually progress from 2-5 mins. Pt verbalized understanding. Pt required moderate VCs and TCs for seated diaphragmatic breathing to avoid bodily compensations (increased chest excursion). Pt responded positively to active and educational interventions. Patient will benefit from skilled therapeutic intervention to address deficits in IAP management, PFM coordination, PFM strength, LE strength, ROM, posture, pain, and scar mobilityand scar mobility in order to increase PLOF and improve overall QOL.    Objective Impairments: decreased coordination, decreased strength, increased fascial restrictions, improper body mechanics, postural dysfunction, and pain.   Activity Limitations: carrying, lifting, squatting, continence, toileting, and caring for others  Personal Factors: 1 comorbidity: postpartum depression  are also affecting patient's functional outcome.   Rehab Potential: Good  Clinical Decision Making: Evolving/moderate  complexity  Evaluation Complexity: Moderate   GOALS: Goals reviewed with patient? Yes  SHORT TERM GOALS: Target date: 10/31/2022  Patient will demonstrate independence with HEP in order to maximize therapeutic gains and improve carryover from physical therapy sessions to ADLs in the home and community. Baseline: toileting posture handout/scar mobilization handout Goal status: INITIAL    LONG TERM GOALS: Target date: 12/05/2022   Patient will demonstrate circumferential and sequential contraction of >3/5 MMT, > 5 sec hold x5 and 5 consecutive quick flicks with </= 10 min rest between testing bouts, and relaxation of the PFM coordinated with breath for improved management of intra-abdominal pressure and normal bowel and bladder function without the presence of pain nor incontinence in order to improve participation at home and in the community. Baseline: 3/5 MMT/no endurance tested  Goal status: INITIAL  2.  Patient will demonstrate coordinated lengthening and relaxation of PFM with diaphragmatic inhalation in order to decrease spasm and allow for unrestricted elimination of urine/feces for improved overall QOL. Baseline: has hemorrhoids and pain initially with a BM/toileting posture reviewed, present but inconsistent breath Goal status: INITIAL  3.  Patient will report decreased frequency of having to change undergarments as indicated by a 24 hour period to demonstrate improved bowel control/improved PFM coordination and allow for increased participation in activities outside of the home. Baseline: 3-4x/week changing underwear due to bowel leakage Goal status: INITIAL  4.  Patient will report being able to return to activities including, but not limited to: penetrative sex, ability to participate in annual GYN exams/Papsmear, hiking/increased physical activity without pain or limitation to indicate complete resolution of one of the Pt's concerns and return to prior level of participation  at home and in the community. Baseline: before pregnancy having pain with penetrative sex and unable to follow through with Papsmear due to pain, is walking now but has not returned to gym Goal status: INITIAL  5.  Patient will score >/= 83 and 69 on FOTO Urinary Problem and Bowel Leakage  in order to demonstrate improved IAP management, improved PFM coordination, and overall QOL.  Baseline: 75 and  55 Goal status: INITIAL    PLAN: PT Frequency: 1x/week  PT Duration: 10 weeks  Planned Interventions: Therapeutic exercises, Therapeutic activity, Neuromuscular re-education, Balance training, Gait training, Patient/Family education, Self Care, Joint mobilization, Spinal mobilization, Cryotherapy, Moist heat, scar mobilization, Taping, and Manual therapy  Plan For Next Session: thoracic rotation, PFM contractions, how was breathing?/deep core, how is leakage?   Ryatt Corsino, PT, DPT  10/09/2022, 10:22 AM

## 2022-10-16 ENCOUNTER — Ambulatory Visit: Payer: BC Managed Care – PPO

## 2022-10-16 DIAGNOSIS — R278 Other lack of coordination: Secondary | ICD-10-CM

## 2022-10-16 DIAGNOSIS — M6289 Other specified disorders of muscle: Secondary | ICD-10-CM

## 2022-10-16 DIAGNOSIS — M6281 Muscle weakness (generalized): Secondary | ICD-10-CM

## 2022-10-16 NOTE — Therapy (Signed)
OUTPATIENT PHYSICAL THERAPY FEMALE PELVIC TREATMENT   Patient Name: Yvette Tran MRN: [id number] DOB:2001-08-17, 21 y.o., female Today's Date: 10/16/2022   PT End of Session - 10/16/22 1144     Visit Number 4    Number of Visits 10    Date for PT Re-Evaluation 12/05/22    Authorization Type IE: 09/26/22    PT Start Time 1145    PT Stop Time 1225    PT Time Calculation (min) 40 min    Activity Tolerance Patient tolerated treatment well             History reviewed. No pertinent past medical history. Past Surgical History:  Procedure Laterality Date   APPENDECTOMY     Patient Active Problem List   Diagnosis Date Noted   Postpartum hemorrhage, delivered 08/01/2022   Acute blood loss anemia 08/01/2022   Uterine contractions 07/30/2022   Encounter for supervision of normal pregnancy in third trimester 01/26/2022    PCP: None per Pt  REFERRING PROVIDER: Janyce Llanos, CNM   REFERRING DIAG:  O70.20 (ICD-10-CM) - Third degree perineal laceration during delivery, unspecified   THERAPY DIAG:  Pelvic floor dysfunction  Other lack of coordination  Muscle weakness (generalized)  Rationale for Evaluation and Treatment: Rehabilitation  ONSET DATE: After birth in August                                                                                                                                                                                          PRECAUTIONS: None  WEIGHT BEARING RESTRICTIONS: No  FALLS:  Has patient fallen in last 6 months? No  OCCUPATION/SOCIAL ACTIVITIES: Staying at home with 103 month old, walking, hiking trails, reading, baking/cooking, would like to return to gym   PLOF: Independent   CHIEF CONCERN: Pt was recommended to PFPT. Pt has not returned to sexual activity and currently has hemorrhoids. Pt feels discomfort/pain initially when having a BM.    PATIENT GOALS: Pt would like not to have bowel leakage, be more confident  and educational about her body postpartum    UROLOGICAL HISTORY  Fluid intake: water, Body Armour, smoothies   Pain with urination: No Fully empty bladder: No Stream: Strong Urgency: No Frequency: every 3 hrs Nocturia: 0x - but hard to determine due to breastfeeding during the night  Leakage:  None Pads: No   GASTROINTESTINAL HISTORY Pain with bowel movement: Yes Type of bowel movement:Type (Bristol Stool Scale) - on average Type 4 and Strain No Frequency: 1x/day  Fully empty rectum: No Leakage: Yes, 3-4x/week having to change underwear    SEXUAL HISTORY/FUNCTION Pain with intercourse: During Penetration, After  Intercourse, and Pain Interrupts Intercourse Ability to have vaginal penetration:  Yes: has not tried since giving birth Able to achieve orgasm?: Yes  OBSTETRICAL HISTORY Vaginal deliveries: G1P1 Tearing: Yes: Grade 3 tear  C-section deliveries: none Currently pregnant: No  GYNECOLOGICAL HISTORY Hysterectomy: no Pelvic Organ Prolapse: None Pain with exam: - has not had Papsmear done  Heaviness/pressure: no  SUBJECTIVE:  Pt has been able to practice breathing as part of HEP. Pt has been able to practice scar mobs and does have numbness but no pain.    PAIN:  Are you having pain? No    OBJECTIVE:    COGNITION: Overall cognitive status: Within functional limits for tasks assessed     POSTURE:   Iliac crest height: L iliac crest, L shoulder elevated Pelvic obliquity: WNL   RANGE OF MOTION:   (Norm range in degrees)  LEFT 10/02/22 RIGHT 10/02/22  Lumbar forward flexion (65):  WNL    Lumbar extension (30): WNL    Lumbar lateral flexion (25):  WNL WNL  Thoracic and Lumbar rotation (30 degrees):    WNL Restricted  Hip Flexion (0-125):   WNL WNL  Hip IR (0-45):  WNL WNL  Hip ER (0-45):  WNL WNL  Hip Adduction:      Hip Abduction (0-40):  WNL WNL  Hip extension (0-15):     (*= pain, Blank rows = not tested)   STRENGTH: MMT    RLE 10/02/22  LLE 10/02/22  Hip Flexion 4 4  Hip Extension 5 5  Hip Abduction     Hip Adduction     Hip ER  5 5  Hip IR  5 5  Knee Extension 5 5  Knee Flexion 5 5  Dorsiflexion     Plantarflexion (seated) 5 5  (*= pain, Blank rows = not tested)   SPECIAL TESTS:   FABER (SN 81): negative B FADIR (SN 94): negative B   PALPATION:  Abdominal:  Diastasis: none Scar mobility: present, at perineum - not observed but subjective  Rib flare: none  -R middle of abdomen with increased tenderness upon palpation   EXTERNAL PELVIC EXAM: Patient educated on the purpose of the pelvic exam and articulated understanding; patient consented to the exam verbally. Breath coordination: present but inconsistent Voluntary Contraction: present, 3/5 MMT Relaxation: full Perineal movement with sustained IAP increase ("bear down"): descent but with Valsalva maneuver  Perineal movement with rapid IAP increase ("cough"): no change (0= no contraction, 1= flicker, 2= weak squeeze, 3= fair squeeze with lift, 4= good squeeze and lift against resistance, 5= strong squeeze against strong resistance)   TODAY'S TREATMENT  Neuromuscular Re-education: Discussion and demonstration via pictures of Pelvic Floor Level 1 course. Pt given Exploring Genitals handout.   Discussion on purpose of external anal sphincter and the reasoning for having bowel leakage after Grade 3 tear. Diaphragmatic breathing has helped and scar mobs as Pt reports no bowel leakage this past week.   Supine hooklying diaphragmatic breathing with VCs and TCs for downregulation of the nervous system and improved management of IAP   Supine hooklying PFM lengthening techniques with diaphragmatic breathing, VCs and TCs as needed              B Single knee to chest              "Happy baby" pose   Butterfly pose "adductor stretch"   Right sidelying thoracic rotations, x10, for improved lengthening of the anterior fascial slings    Patient  response to  interventions: Pt excited to start scar mobilizations   Patient Education:  Patient provided with HEP including: exploration handout, PFM lengthening, thoracic rotation. Patient educated throughout session on appropriate technique and form using multi-modal cueing, HEP, and activity modification. Patient will benefit from further education in order to maximize compliance and understanding for long-term therapeutic gains.     ASSESSMENT:  Clinical Impression: Patient presents to clinic with excellent motivation to participate in today's session. Pt continues to demonstrate deficits in IAP management, PFM coordination, PFM strength, LE strength, ROM, posture, pain, and scar mobility. Pt with no bowel leakage this past week and noticed with scar mobilization that her perineal scar is taut and numb. Brief discussion on vulvar anatomy and the diversity in vulvas and color. Pt verbalized understanding and given educational handout. Pt required significant cueing during PFM lengthening techniques for diaphragmatic breathing. With increased time, Pt able to decrease activation of superficial abdominal muscles during inhalation. Pt required moderate VCs and TCs for further active interventions after cueing for proper diaphragmatic breathing. Pt responded positively to active and educational interventions. Patient will benefit from skilled therapeutic intervention to address deficits in IAP management, PFM coordination, PFM strength, LE strength, ROM, posture, pain, and scar mobilityand scar mobility in order to increase PLOF and improve overall QOL.    Objective Impairments: decreased coordination, decreased strength, increased fascial restrictions, improper body mechanics, postural dysfunction, and pain.   Activity Limitations: carrying, lifting, squatting, continence, toileting, and caring for others  Personal Factors: 1 comorbidity: postpartum depression  are also affecting patient's functional  outcome.   Rehab Potential: Good  Clinical Decision Making: Evolving/moderate complexity  Evaluation Complexity: Moderate   GOALS: Goals reviewed with patient? Yes  SHORT TERM GOALS: Target date: 10/31/2022  Patient will demonstrate independence with HEP in order to maximize therapeutic gains and improve carryover from physical therapy sessions to ADLs in the home and community. Baseline: toileting posture handout/scar mobilization handout Goal status: INITIAL    LONG TERM GOALS: Target date: 12/05/2022   Patient will demonstrate circumferential and sequential contraction of >3/5 MMT, > 5 sec hold x5 and 5 consecutive quick flicks with </= 10 min rest between testing bouts, and relaxation of the PFM coordinated with breath for improved management of intra-abdominal pressure and normal bowel and bladder function without the presence of pain nor incontinence in order to improve participation at home and in the community. Baseline: 3/5 MMT/no endurance tested  Goal status: INITIAL  2.  Patient will demonstrate coordinated lengthening and relaxation of PFM with diaphragmatic inhalation in order to decrease spasm and allow for unrestricted elimination of urine/feces for improved overall QOL. Baseline: has hemorrhoids and pain initially with a BM/toileting posture reviewed, present but inconsistent breath Goal status: INITIAL  3.  Patient will report decreased frequency of having to change undergarments as indicated by a 24 hour period to demonstrate improved bowel control/improved PFM coordination and allow for increased participation in activities outside of the home. Baseline: 3-4x/week changing underwear due to bowel leakage Goal status: INITIAL  4.  Patient will report being able to return to activities including, but not limited to: penetrative sex, ability to participate in annual GYN exams/Papsmear, hiking/increased physical activity without pain or limitation to indicate complete  resolution of one of the Pt's concerns and return to prior level of participation at home and in the community. Baseline: before pregnancy having pain with penetrative sex and unable to follow through with Papsmear due to pain, is walking now but  has not returned to gym Goal status: INITIAL  5.  Patient will score >/= 83 and 69 on FOTO Urinary Problem and Bowel Leakage  in order to demonstrate improved IAP management, improved PFM coordination, and overall QOL.  Baseline: 75 and 55 Goal status: INITIAL    PLAN: PT Frequency: 1x/week  PT Duration: 10 weeks  Planned Interventions: Therapeutic exercises, Therapeutic activity, Neuromuscular re-education, Balance training, Gait training, Patient/Family education, Self Care, Joint mobilization, Spinal mobilization, Cryotherapy, Moist heat, scar mobilization, Taping, and Manual therapy  Plan For Next Session: possible look at scar and review techniques. Continue PFM lengthening techniques, begin deep core   Carolyn Sylvia, PT, DPT  10/16/2022, 11:44 AM

## 2022-10-24 ENCOUNTER — Ambulatory Visit: Payer: BC Managed Care – PPO | Attending: Certified Nurse Midwife

## 2022-10-24 DIAGNOSIS — M6281 Muscle weakness (generalized): Secondary | ICD-10-CM

## 2022-10-24 DIAGNOSIS — M6289 Other specified disorders of muscle: Secondary | ICD-10-CM

## 2022-10-24 DIAGNOSIS — R278 Other lack of coordination: Secondary | ICD-10-CM | POA: Diagnosis present

## 2022-10-24 NOTE — Therapy (Signed)
OUTPATIENT PHYSICAL THERAPY FEMALE PELVIC TREATMENT   Patient Name: Yvette Tran MRN: [id number] DOB:2001/05/01, 21 y.o., female Today's Date: 10/24/2022   PT End of Session - 10/24/22 1101     Visit Number 5    Number of Visits 10    Date for PT Re-Evaluation 12/05/22    Authorization Type IE: 09/26/22    PT Start Time 1100    PT Stop Time 1140    PT Time Calculation (min) 40 min    Activity Tolerance Patient tolerated treatment well             History reviewed. No pertinent past medical history. Past Surgical History:  Procedure Laterality Date   APPENDECTOMY     Patient Active Problem List   Diagnosis Date Noted   Postpartum hemorrhage, delivered 08/01/2022   Acute blood loss anemia 08/01/2022   Uterine contractions 07/30/2022   Encounter for supervision of normal pregnancy in third trimester 01/26/2022    PCP: None per Pt  REFERRING PROVIDER: Janyce Llanos, CNM   REFERRING DIAG:  O70.20 (ICD-10-CM) - Third degree perineal laceration during delivery, unspecified   THERAPY DIAG:  Pelvic floor dysfunction  Other lack of coordination  Muscle weakness (generalized)  Rationale for Evaluation and Treatment: Rehabilitation  ONSET DATE: After birth in August                                                                                                                                                                                          PRECAUTIONS: None  WEIGHT BEARING RESTRICTIONS: No  FALLS:  Has patient fallen in last 6 months? No  OCCUPATION/SOCIAL ACTIVITIES: Staying at home with 17 month old, walking, hiking trails, reading, baking/cooking, would like to return to gym   PLOF: Independent   CHIEF CONCERN: Pt was recommended to PFPT. Pt has not returned to sexual activity and currently has hemorrhoids. Pt feels discomfort/pain initially when having a BM.    PATIENT GOALS: Pt would like not to have bowel leakage, be more confident  and educational about her body postpartum    UROLOGICAL HISTORY  Fluid intake: water, Body Armour, smoothies   Pain with urination: No Fully empty bladder: No Stream: Strong Urgency: No Frequency: every 3 hrs Nocturia: 0x - but hard to determine due to breastfeeding during the night  Leakage:  None Pads: No   GASTROINTESTINAL HISTORY Pain with bowel movement: Yes Type of bowel movement:Type (Bristol Stool Scale) - on average Type 4 and Strain No Frequency: 1x/day  Fully empty rectum: No Leakage: Yes, 3-4x/week having to change underwear    SEXUAL HISTORY/FUNCTION Pain with intercourse: During Penetration, After  Intercourse, and Pain Interrupts Intercourse Ability to have vaginal penetration:  Yes: has not tried since giving birth Able to achieve orgasm?: Yes  OBSTETRICAL HISTORY Vaginal deliveries: G1P1 Tearing: Yes: Grade 3 tear  C-section deliveries: none Currently pregnant: No  GYNECOLOGICAL HISTORY Hysterectomy: no Pelvic Organ Prolapse: None Pain with exam: - has not had Papsmear done  Heaviness/pressure: no  SUBJECTIVE:  Pt hasn't been able to practice scar mobs this past week. Pt has noticed more loser stools (Type 7). Pt has not had any bowel leakage or smearing this past week.    PAIN:  Are you having pain? No    OBJECTIVE:    COGNITION: Overall cognitive status: Within functional limits for tasks assessed     POSTURE:   Iliac crest height: L iliac crest, L shoulder elevated Pelvic obliquity: WNL   RANGE OF MOTION:   (Norm range in degrees)  LEFT 10/02/22 RIGHT 10/02/22  Lumbar forward flexion (65):  WNL    Lumbar extension (30): WNL    Lumbar lateral flexion (25):  WNL WNL  Thoracic and Lumbar rotation (30 degrees):    WNL Restricted  Hip Flexion (0-125):   WNL WNL  Hip IR (0-45):  WNL WNL  Hip ER (0-45):  WNL WNL  Hip Adduction:      Hip Abduction (0-40):  WNL WNL  Hip extension (0-15):     (*= pain, Blank rows = not  tested)   STRENGTH: MMT    RLE 10/02/22 LLE 10/02/22  Hip Flexion 4 4  Hip Extension 5 5  Hip Abduction     Hip Adduction     Hip ER  5 5  Hip IR  5 5  Knee Extension 5 5  Knee Flexion 5 5  Dorsiflexion     Plantarflexion (seated) 5 5  (*= pain, Blank rows = not tested)   SPECIAL TESTS:   FABER (SN 81): negative B  FADIR (SN 94): negative B   PALPATION:  Abdominal:  Diastasis: none Scar mobility: present, at perineum - not observed but subjective  Rib flare: none  -R middle of abdomen with increased tenderness upon palpation   EXTERNAL PELVIC EXAM: Patient educated on the purpose of the pelvic exam and articulated understanding; patient consented to the exam verbally. Breath coordination: present but inconsistent Voluntary Contraction: present, 3/5 MMT Relaxation: full Perineal movement with sustained IAP increase ("bear down"): descent but with Valsalva maneuver  Perineal movement with rapid IAP increase ("cough"): no change (0= no contraction, 1= flicker, 2= weak squeeze, 3= fair squeeze with lift, 4= good squeeze and lift against resistance, 5= strong squeeze against strong resistance)    TODAY'S TREATMENT  Manual Therapy: Scar mobilization in circular/vertical/horizontal motions for improved tissue extensibility  Grade 3 perineal scar above anal opening  Significant scar restriction at center of scar below vaginal opening (increased burning sensation and sore) Increased pain when palpated to the L   Neuromuscular Re-education: Supine hooklying diaphragmatic breathing with VCs and TCs for downregulation of the nervous system to prepare for internal/external assessment of scar   INTERNAL PELVIC EXAM: Patient educated on the purpose of the pelvic exam and articulated understanding; patient consented to the exam verbally.  Introitus Appears: WNL Skin integrity: WNL, some red bumps at mons pubis but due to ingrown hairs per Pt Scar mobility: Grade 3 perineal  scar above anal opening  Significant scar restriction at center of scar below vaginal opening (increased burning sensation and sore) Increased pain when palpated to the  L  Sensation: WNLs with light touch   Discussion on involving partner in scar mobilization techniques guided by the Pt just as DPT was guided during assessment. DPT advised to continue with diaphragmatic breathing to control discomfort and hip movements (single knee to chest). DPT also advised 2-5 mins of scar mobilization techniques.   Discussion after assessment about where scar is located and will continue to demonstrate and educate Pt on scar next session.  Discussion on use of ice/heat modalities later to aid in decreasing soreness around vulva. Pt verbalized understanding.    Patient response to interventions: Pt with some soreness after scar mobilization techniques    Patient Education:  Patient provided with HEP including: no change. Patient educated throughout session on appropriate technique and form using multi-modal cueing, HEP, and activity modification. Patient will benefit from further education in order to maximize compliance and understanding for long-term therapeutic gains.     ASSESSMENT:  Clinical Impression: Patient presents to clinic with excellent motivation to participate in today's session. Pt continues to demonstrate deficits in IAP management, PFM coordination, PFM strength, LE strength, ROM, posture, pain, and scar mobility. Pt continues to have no bowel leakage. After discussion on what an internal assessment will assess, Pt provided consent. Upon initial observation, Pt's vulva WNLs with some mild irritation noted at mons pubis (ingrown hair follicles per Pt). Sensation of LEs and vulva WNLs. Palpation of the perineal scar initially with some discomfort and demonstrates significant scar restriction resulting in a burning-type of sensation. Pt also with increased discomfort to the R of the scar  but with significant pain when palpated to the L. During manual techniques (scar mobs), Pt able to control pain and burning sensation with diaphragmatic breathing and hip movements. Able to provide gentle pressure at center of scar with circular movements. Pt responded positively to manual, active, and educational interventions. Patient will benefit from skilled therapeutic intervention to address deficits in IAP management, PFM coordination, PFM strength, LE strength, ROM, posture, pain, and scar mobilityand scar mobility in order to increase PLOF and improve overall QOL.    Objective Impairments: decreased coordination, decreased strength, increased fascial restrictions, improper body mechanics, postural dysfunction, and pain.   Activity Limitations: carrying, lifting, squatting, continence, toileting, and caring for others  Personal Factors: 1 comorbidity: postpartum depression  are also affecting patient's functional outcome.   Rehab Potential: Good  Clinical Decision Making: Evolving/moderate complexity  Evaluation Complexity: Moderate   GOALS: Goals reviewed with patient? Yes  SHORT TERM GOALS: Target date: 10/31/2022  Patient will demonstrate independence with HEP in order to maximize therapeutic gains and improve carryover from physical therapy sessions to ADLs in the home and community. Baseline: toileting posture handout/scar mobilization handout Goal status: INITIAL    LONG TERM GOALS: Target date: 12/05/2022   Patient will demonstrate circumferential and sequential contraction of >3/5 MMT, > 5 sec hold x5 and 5 consecutive quick flicks with </= 10 min rest between testing bouts, and relaxation of the PFM coordinated with breath for improved management of intra-abdominal pressure and normal bowel and bladder function without the presence of pain nor incontinence in order to improve participation at home and in the community. Baseline: 3/5 MMT/no endurance tested  Goal  status: INITIAL  2.  Patient will demonstrate coordinated lengthening and relaxation of PFM with diaphragmatic inhalation in order to decrease spasm and allow for unrestricted elimination of urine/feces for improved overall QOL. Baseline: has hemorrhoids and pain initially with a BM/toileting posture reviewed, present but inconsistent  breath Goal status: INITIAL  3.  Patient will report decreased frequency of having to change undergarments as indicated by a 24 hour period to demonstrate improved bowel control/improved PFM coordination and allow for increased participation in activities outside of the home. Baseline: 3-4x/week changing underwear due to bowel leakage Goal status: INITIAL  4.  Patient will report being able to return to activities including, but not limited to: penetrative sex, ability to participate in annual GYN exams/Papsmear, hiking/increased physical activity without pain or limitation to indicate complete resolution of one of the Pt's concerns and return to prior level of participation at home and in the community. Baseline: before pregnancy having pain with penetrative sex and unable to follow through with Papsmear due to pain, is walking now but has not returned to gym Goal status: INITIAL  5.  Patient will score >/= 83 and 69 on FOTO Urinary Problem and Bowel Leakage  in order to demonstrate improved IAP management, improved PFM coordination, and overall QOL.  Baseline: 75 and 55 Goal status: INITIAL    PLAN: PT Frequency: 1x/week  PT Duration: 10 weeks  Planned Interventions: Therapeutic exercises, Therapeutic activity, Neuromuscular re-education, Balance training, Gait training, Patient/Family education, Self Care, Joint mobilization, Spinal mobilization, Cryotherapy, Moist heat, scar mobilization, Taping, and Manual therapy  Plan For Next Session: scar techniques, finish PFM lengthening, start deep core/lifting techniques   Ronit Cranfield, PT,  DPT  10/24/2022, 11:01 AM

## 2022-10-31 ENCOUNTER — Ambulatory Visit: Payer: BC Managed Care – PPO

## 2022-10-31 DIAGNOSIS — M6281 Muscle weakness (generalized): Secondary | ICD-10-CM

## 2022-10-31 DIAGNOSIS — M6289 Other specified disorders of muscle: Secondary | ICD-10-CM | POA: Diagnosis not present

## 2022-10-31 DIAGNOSIS — R278 Other lack of coordination: Secondary | ICD-10-CM

## 2022-10-31 NOTE — Therapy (Signed)
OUTPATIENT PHYSICAL THERAPY FEMALE PELVIC TREATMENT   Patient Name: Yvette Tran MRN: [id number] DOB:07/07/2001, 21 y.o., female Today's Date: 10/31/2022   PT End of Session - 10/31/22 1104     Visit Number 6    Number of Visits 10    Date for PT Re-Evaluation 12/05/22    Authorization Type IE: 09/26/22    PT Start Time 1102    PT Stop Time 1142    PT Time Calculation (min) 40 min    Activity Tolerance Patient tolerated treatment well             History reviewed. No pertinent past medical history. Past Surgical History:  Procedure Laterality Date   APPENDECTOMY     Patient Active Problem List   Diagnosis Date Noted   Postpartum hemorrhage, delivered 08/01/2022   Acute blood loss anemia 08/01/2022   Uterine contractions 07/30/2022   Encounter for supervision of normal pregnancy in third trimester 01/26/2022    PCP: None per Pt  REFERRING PROVIDER: Janyce Llanos, CNM   REFERRING DIAG:  O70.20 (ICD-10-CM) - Third degree perineal laceration during delivery, unspecified   THERAPY DIAG:  Pelvic floor dysfunction  Muscle weakness (generalized)  Other lack of coordination  Rationale for Evaluation and Treatment: Rehabilitation  ONSET DATE: After birth in August                                                                                                                                                                                          PRECAUTIONS: None  WEIGHT BEARING RESTRICTIONS: No  FALLS:  Has patient fallen in last 6 months? No  OCCUPATION/SOCIAL ACTIVITIES: Staying at home with 52 month old, walking, hiking trails, reading, baking/cooking, would like to return to gym   PLOF: Independent   CHIEF CONCERN: Pt was recommended to PFPT. Pt has not returned to sexual activity and currently has hemorrhoids. Pt feels discomfort/pain initially when having a BM.    PATIENT GOALS: Pt would like not to have bowel leakage, be more confident  and educational about her body postpartum    UROLOGICAL HISTORY  Fluid intake: water, Body Armour, smoothies   Pain with urination: No Fully empty bladder: No Stream: Strong Urgency: No Frequency: every 3 hrs Nocturia: 0x - but hard to determine due to breastfeeding during the night  Leakage:  None Pads: No   GASTROINTESTINAL HISTORY Pain with bowel movement: Yes Type of bowel movement:Type (Bristol Stool Scale) - on average Type 4 and Strain No Frequency: 1x/day  Fully empty rectum: No Leakage: Yes, 3-4x/week having to change underwear    SEXUAL HISTORY/FUNCTION Pain with intercourse: During Penetration, After  Intercourse, and Pain Interrupts Intercourse Ability to have vaginal penetration:  Yes: has not tried since giving birth Able to achieve orgasm?: Yes  OBSTETRICAL HISTORY Vaginal deliveries: G1P1 Tearing: Yes: Grade 3 tear  C-section deliveries: none Currently pregnant: No  GYNECOLOGICAL HISTORY Hysterectomy: no Pelvic Organ Prolapse: None Pain with exam: - has not had Papsmear done  Heaviness/pressure: no  SUBJECTIVE:  Pt has been able to practice scar mobs. Pt did have an incidence of urinary leakage while out in the community. Pt had to change undergarments and pants.    PAIN:  Are you having pain? No    OBJECTIVE:    COGNITION: Overall cognitive status: Within functional limits for tasks assessed     POSTURE:   Iliac crest height: L iliac crest, L shoulder elevated Pelvic obliquity: WNL   RANGE OF MOTION:   (Norm range in degrees)  LEFT 10/02/22 RIGHT 10/02/22  Lumbar forward flexion (65):  WNL    Lumbar extension (30): WNL    Lumbar lateral flexion (25):  WNL WNL  Thoracic and Lumbar rotation (30 degrees):    WNL Restricted  Hip Flexion (0-125):   WNL WNL  Hip IR (0-45):  WNL WNL  Hip ER (0-45):  WNL WNL  Hip Adduction:      Hip Abduction (0-40):  WNL WNL  Hip extension (0-15):     (*= pain, Blank rows = not  tested)   STRENGTH: MMT    RLE 10/02/22 LLE 10/02/22  Hip Flexion 4 4  Hip Extension 5 5  Hip Abduction     Hip Adduction     Hip ER  5 5  Hip IR  5 5  Knee Extension 5 5  Knee Flexion 5 5  Dorsiflexion     Plantarflexion (seated) 5 5  (*= pain, Blank rows = not tested)   SPECIAL TESTS:   FABER (SN 81): negative B  FADIR (SN 94): negative B   PALPATION:  Abdominal:  Diastasis: none Scar mobility: present, at perineum - not observed but subjective  Rib flare: none  -R middle of abdomen with increased tenderness upon palpation   EXTERNAL PELVIC EXAM: Patient educated on the purpose of the pelvic exam and articulated understanding; patient consented to the exam verbally. Breath coordination: present but inconsistent Voluntary Contraction: present, 3/5 MMT Relaxation: full Perineal movement with sustained IAP increase ("bear down"): descent but with Valsalva maneuver  Perineal movement with rapid IAP increase ("cough"): no change (0= no contraction, 1= flicker, 2= weak squeeze, 3= fair squeeze with lift, 4= good squeeze and lift against resistance, 5= strong squeeze against strong resistance)   INTERNAL PELVIC EXAM: Patient educated on the purpose of the pelvic exam and articulated understanding; patient consented to the exam verbally.  Introitus Appears: WNL Skin integrity: WNL, some red bumps at mons pubis but due to ingrown hairs per Pt Scar mobility: Grade 3 perineal scar above anal opening  Significant scar restriction at center of scar below vaginal opening (increased burning sensation and sore) Increased pain when palpated to the L  Sensation: WNLs with light touch   TODAY'S TREATMENT  Neuromuscular Re-education: POSTURE:   Iliac crest height: equal iliac crest height and no shoulder elevation (IE: L iliac crest, L shoulder elevated) Pelvic obliquity: WNL  Discussion and demonstration on lifting techniques/body mechanics during ADLs in the home and caring  for young child for improved IAP management and pain modulation in the lumbar spine Practiced with weighted ball transfers from floor to standing  with breath Practiced with variation of mat height to simulate stroller and basinet   Supine hooklying diaphragmatic breathing with VCs and TCs for downregulation of the nervous system and improved IAP management  [facility] abdominal rehab   Supine hooklying TrA contraction with coordinated exhale    Quadruped diaphragmatic breathing with VCs and TCs for downregulation of the nervous system and improved IAP management   Patient response to interventions: Pt with some soreness after scar mobilization techniques    Patient Education:  Patient provided with HEP including: supine TrA activation, quadruped breathing. Patient educated throughout session on appropriate technique and form using multi-modal cueing, HEP, and activity modification. Patient will benefit from further education in order to maximize compliance and understanding for long-term therapeutic gains.     ASSESSMENT:  Clinical Impression: Patient presents to clinic with excellent motivation to participate in today's session. Pt continues to demonstrate deficits in IAP management, PFM coordination, PFM strength, LE strength, ROM, posture, pain, and scar mobility. Pt with an incident of urinary leakage out in the community and had to change undergarments. After discussion, Pt might of been performing Valsalva maneuver during activity of taking care of the baby or over activating gluteal musculature. Demonstration and practice with weighted ball various lifting techniques and body mechanics with coordinated breath for pain modulation and to avoid injury while taking care of young children and performing ADLs or household chores. Pt required significant VCs and TCs for TrA activation to avoid bodily compensations (superficial abdominal activation). With increased time, Pt able to demonstrate  understanding. Pt responded positively to active and educational interventions. Patient will benefit from skilled therapeutic intervention to address deficits in IAP management, PFM coordination, PFM strength, LE strength, ROM, posture, pain, and scar mobilityand scar mobility in order to increase PLOF and improve overall QOL.    Objective Impairments: decreased coordination, decreased strength, increased fascial restrictions, improper body mechanics, postural dysfunction, and pain.   Activity Limitations: carrying, lifting, squatting, continence, toileting, and caring for others  Personal Factors: 1 comorbidity: postpartum depression  are also affecting patient's functional outcome.   Rehab Potential: Good  Clinical Decision Making: Evolving/moderate complexity  Evaluation Complexity: Moderate   GOALS: Goals reviewed with patient? Yes  SHORT TERM GOALS: Target date: 10/31/2022  Patient will demonstrate independence with HEP in order to maximize therapeutic gains and improve carryover from physical therapy sessions to ADLs in the home and community. Baseline: toileting posture handout/scar mobilization handout Goal status: INITIAL    LONG TERM GOALS: Target date: 12/05/2022   Patient will demonstrate circumferential and sequential contraction of >3/5 MMT, > 5 sec hold x5 and 5 consecutive quick flicks with </= 10 min rest between testing bouts, and relaxation of the PFM coordinated with breath for improved management of intra-abdominal pressure and normal bowel and bladder function without the presence of pain nor incontinence in order to improve participation at home and in the community. Baseline: 3/5 MMT/no endurance tested  Goal status: INITIAL  2.  Patient will demonstrate coordinated lengthening and relaxation of PFM with diaphragmatic inhalation in order to decrease spasm and allow for unrestricted elimination of urine/feces for improved overall QOL. Baseline: has hemorrhoids  and pain initially with a BM/toileting posture reviewed, present but inconsistent breath Goal status: INITIAL  3.  Patient will report decreased frequency of having to change undergarments as indicated by a 24 hour period to demonstrate improved bowel control/improved PFM coordination and allow for increased participation in activities outside of the home. Baseline: 3-4x/week changing underwear  due to bowel leakage Goal status: INITIAL  4.  Patient will report being able to return to activities including, but not limited to: penetrative sex, ability to participate in annual GYN exams/Papsmear, hiking/increased physical activity without pain or limitation to indicate complete resolution of one of the Pt's concerns and return to prior level of participation at home and in the community. Baseline: before pregnancy having pain with penetrative sex and unable to follow through with Papsmear due to pain, is walking now but has not returned to gym Goal status: INITIAL  5.  Patient will score >/= 83 and 69 on FOTO Urinary Problem and Bowel Leakage  in order to demonstrate improved IAP management, improved PFM coordination, and overall QOL.  Baseline: 75 and 55 Goal status: INITIAL    PLAN: PT Frequency: 1x/week  PT Duration: 10 weeks  Planned Interventions: Therapeutic exercises, Therapeutic activity, Neuromuscular re-education, Balance training, Gait training, Patient/Family education, Self Care, Joint mobilization, Spinal mobilization, Cryotherapy, Moist heat, scar mobilization, Taping, and Manual therapy  Plan For Next Session:  scar techniques, finish PFM lengthening, how was deep core?, continue   Chanta Bauers, PT, DPT  10/31/2022, 11:04 AM

## 2022-11-07 ENCOUNTER — Ambulatory Visit: Payer: BC Managed Care – PPO

## 2022-11-14 ENCOUNTER — Ambulatory Visit: Payer: BC Managed Care – PPO

## 2022-11-14 DIAGNOSIS — R278 Other lack of coordination: Secondary | ICD-10-CM

## 2022-11-14 DIAGNOSIS — M6289 Other specified disorders of muscle: Secondary | ICD-10-CM | POA: Diagnosis not present

## 2022-11-14 DIAGNOSIS — M6281 Muscle weakness (generalized): Secondary | ICD-10-CM

## 2022-11-14 NOTE — Therapy (Signed)
OUTPATIENT PHYSICAL THERAPY FEMALE PELVIC TREATMENT   Patient Name: Yvette Tran MRN: [id number] DOB:02/16/2001, 21 y.o., female Today's Date: 11/14/2022   PT End of Session - 11/14/22 1102     Visit Number 7    Number of Visits 10    Date for PT Re-Evaluation 12/05/22    Authorization Type IE: 09/26/22    PT Start Time 1100    PT Stop Time 1138    PT Time Calculation (min) 38 min    Activity Tolerance Patient tolerated treatment well             History reviewed. No pertinent past medical history. Past Surgical History:  Procedure Laterality Date   APPENDECTOMY     Patient Active Problem List   Diagnosis Date Noted   Postpartum hemorrhage, delivered 08/01/2022   Acute blood loss anemia 08/01/2022   Uterine contractions 07/30/2022   Encounter for supervision of normal pregnancy in third trimester 01/26/2022    PCP: None per Pt  REFERRING PROVIDER: Janyce Llanos, CNM   REFERRING DIAG:  O70.20 (ICD-10-CM) - Third degree perineal laceration during delivery, unspecified   THERAPY DIAG:  Pelvic floor dysfunction  Muscle weakness (generalized)  Other lack of coordination  Rationale for Evaluation and Treatment: Rehabilitation  ONSET DATE: After birth in August                                                                                                                                                                                          PRECAUTIONS: None  WEIGHT BEARING RESTRICTIONS: No  FALLS:  Has patient fallen in last 6 months? No  OCCUPATION/SOCIAL ACTIVITIES: Staying at home with 33 month old, walking, hiking trails, reading, baking/cooking, would like to return to gym   PLOF: Independent   CHIEF CONCERN: Pt was recommended to PFPT. Pt has not returned to sexual activity and currently has hemorrhoids. Pt feels discomfort/pain initially when having a BM.    PATIENT GOALS: Pt would like not to have bowel leakage, be more confident  and educational about her body postpartum    UROLOGICAL HISTORY  Fluid intake: water, Body Armour, smoothies   Pain with urination: No Fully empty bladder: No Stream: Strong Urgency: No Frequency: every 3 hrs Nocturia: 0x - but hard to determine due to breastfeeding during the night  Leakage:  None Pads: No   GASTROINTESTINAL HISTORY Pain with bowel movement: Yes Type of bowel movement:Type (Bristol Stool Scale) - on average Type 4 and Strain No Frequency: 1x/day  Fully empty rectum: No Leakage: Yes, 3-4x/week having to change underwear    SEXUAL HISTORY/FUNCTION Pain with intercourse: During Penetration, After  Intercourse, and Pain Interrupts Intercourse Ability to have vaginal penetration:  Yes: has not tried since giving birth Able to achieve orgasm?: Yes  OBSTETRICAL HISTORY Vaginal deliveries: G1P1 Tearing: Yes: Grade 3 tear  C-section deliveries: none Currently pregnant: No  GYNECOLOGICAL HISTORY Hysterectomy: no Pelvic Organ Prolapse: None Pain with exam: - has not had Papsmear done  Heaviness/pressure: no  SUBJECTIVE:  Pt has not had bowel or bladder leakage especially with increased traveling the past week. Pt even walked a 5K with no leakage.    PAIN:  Are you having pain? No    OBJECTIVE:    COGNITION: Overall cognitive status: Within functional limits for tasks assessed     POSTURE:   Iliac crest height: L iliac crest, L shoulder elevated Pelvic obliquity: WNL   RANGE OF MOTION:   (Norm range in degrees)  LEFT 10/02/22 RIGHT 10/02/22  Lumbar forward flexion (65):  WNL    Lumbar extension (30): WNL    Lumbar lateral flexion (25):  WNL WNL  Thoracic and Lumbar rotation (30 degrees):    WNL Restricted  Hip Flexion (0-125):   WNL WNL  Hip IR (0-45):  WNL WNL  Hip ER (0-45):  WNL WNL  Hip Adduction:      Hip Abduction (0-40):  WNL WNL  Hip extension (0-15):     (*= pain, Blank rows = not tested)   STRENGTH: MMT    RLE 10/02/22  LLE 10/02/22  Hip Flexion 4 4  Hip Extension 5 5  Hip Abduction     Hip Adduction     Hip ER  5 5  Hip IR  5 5  Knee Extension 5 5  Knee Flexion 5 5  Dorsiflexion     Plantarflexion (seated) 5 5  (*= pain, Blank rows = not tested)   SPECIAL TESTS:   FABER (SN 81): negative B  FADIR (SN 94): negative B   PALPATION:  Abdominal:  Diastasis: none Scar mobility: present, at perineum - not observed but subjective  Rib flare: none  -R middle of abdomen with increased tenderness upon palpation   EXTERNAL PELVIC EXAM: Patient educated on the purpose of the pelvic exam and articulated understanding; patient consented to the exam verbally. Breath coordination: present but inconsistent Voluntary Contraction: present, 3/5 MMT Relaxation: full Perineal movement with sustained IAP increase ("bear down"): descent but with Valsalva maneuver  Perineal movement with rapid IAP increase ("cough"): no change (0= no contraction, 1= flicker, 2= weak squeeze, 3= fair squeeze with lift, 4= good squeeze and lift against resistance, 5= strong squeeze against strong resistance)   INTERNAL PELVIC EXAM: Patient educated on the purpose of the pelvic exam and articulated understanding; patient consented to the exam verbally.  Introitus Appears: WNL Skin integrity: WNL, some red bumps at mons pubis but due to ingrown hairs per Pt Scar mobility: Grade 3 perineal scar above anal opening  Significant scar restriction at center of scar below vaginal opening (increased burning sensation and sore) Increased pain when palpated to the L  Sensation: WNLs with light touch   TODAY'S TREATMENT  Neuromuscular Re-education: Seated diaphragmatic breathing with VCs and TCs for downregulation of the nervous system and improved IAP management   Seated TrA activation with coordinated exhale for improved IAP management, VCs and TCs required   Seated TrA activation with LE challenge and coordinated breathing for  improved IAP management, VCs and TCs required    Quadruped diaphragmatic breathing with VCs and TCs for downregulation of the nervous  system and improved IAP management  Quadruped TrA activation with coordinated exhale for improved IAP management, VCs and TCs required  Added UE challenge   Discussion on breastfeeding positioning and use of multiple pillows and proper spinal alignment.    Patient response to interventions: Pt able to feel more TrA activation with UE challenge in quadruped    Patient Education:  Patient provided with HEP including: seated and quadruped TrA, quadruped breathing with TrA activation and UE challenge. Patient educated throughout session on appropriate technique and form using multi-modal cueing, HEP, and activity modification. Patient will benefit from further education in order to maximize compliance and understanding for long-term therapeutic gains.     ASSESSMENT:  Clinical Impression: Patient presents to clinic with excellent motivation to participate in today's session. Pt continues to demonstrate deficits in IAP management, PFM coordination, PFM strength, LE strength, ROM, posture, pain, and scar mobility. Pt has reported no bowel leakage and improved urinary leakage especially as Pt with increased physical activity. Pt moderate VCs and TCs for TrA activation challenge in various positions with either UE/LE movement to avoid bodily compensations (superficial abdominal activation). With increased time, Pt able to demonstrate understanding. Pt responded positively to active and educational interventions. Patient will benefit from skilled therapeutic intervention to address deficits in IAP management, PFM coordination, PFM strength, LE strength, ROM, posture, pain, and scar mobilityand scar mobility in order to increase PLOF and improve overall QOL.    Objective Impairments: decreased coordination, decreased strength, increased fascial restrictions, improper  body mechanics, postural dysfunction, and pain.   Activity Limitations: carrying, lifting, squatting, continence, toileting, and caring for others  Personal Factors: 1 comorbidity: postpartum depression  are also affecting patient's functional outcome.   Rehab Potential: Good  Clinical Decision Making: Evolving/moderate complexity  Evaluation Complexity: Moderate   GOALS: Goals reviewed with patient? Yes  SHORT TERM GOALS: Target date: 10/31/2022  Patient will demonstrate independence with HEP in order to maximize therapeutic gains and improve carryover from physical therapy sessions to ADLs in the home and community. Baseline: toileting posture handout/scar mobilization handout Goal status: INITIAL    LONG TERM GOALS: Target date: 12/05/2022   Patient will demonstrate circumferential and sequential contraction of >3/5 MMT, > 5 sec hold x5 and 5 consecutive quick flicks with </= 10 min rest between testing bouts, and relaxation of the PFM coordinated with breath for improved management of intra-abdominal pressure and normal bowel and bladder function without the presence of pain nor incontinence in order to improve participation at home and in the community. Baseline: 3/5 MMT/no endurance tested  Goal status: INITIAL  2.  Patient will demonstrate coordinated lengthening and relaxation of PFM with diaphragmatic inhalation in order to decrease spasm and allow for unrestricted elimination of urine/feces for improved overall QOL. Baseline: has hemorrhoids and pain initially with a BM/toileting posture reviewed, present but inconsistent breath Goal status: INITIAL  3.  Patient will report decreased frequency of having to change undergarments as indicated by a 24 hour period to demonstrate improved bowel control/improved PFM coordination and allow for increased participation in activities outside of the home. Baseline: 3-4x/week changing underwear due to bowel leakage Goal status:  INITIAL  4.  Patient will report being able to return to activities including, but not limited to: penetrative sex, ability to participate in annual GYN exams/Papsmear, hiking/increased physical activity without pain or limitation to indicate complete resolution of one of the Pt's concerns and return to prior level of participation at home and in the community.  Baseline: before pregnancy having pain with penetrative sex and unable to follow through with Papsmear due to pain, is walking now but has not returned to gym Goal status: INITIAL  5.  Patient will score >/= 83 and 69 on FOTO Urinary Problem and Bowel Leakage  in order to demonstrate improved IAP management, improved PFM coordination, and overall QOL.  Baseline: 75 and 55 Goal status: INITIAL    PLAN: PT Frequency: 1x/week  PT Duration: 10 weeks  Planned Interventions: Therapeutic exercises, Therapeutic activity, Neuromuscular re-education, Balance training, Gait training, Patient/Family education, Self Care, Joint mobilization, Spinal mobilization, Cryotherapy, Moist heat, scar mobilization, Taping, and Manual therapy  Plan For Next Session:  standing core, full bird dog, supine isometric, scar techniques,  Evely Gainey, PT, DPT  11/14/2022, 11:38 AM

## 2022-11-23 ENCOUNTER — Ambulatory Visit: Payer: BC Managed Care – PPO | Attending: Certified Nurse Midwife

## 2022-11-23 DIAGNOSIS — M6281 Muscle weakness (generalized): Secondary | ICD-10-CM | POA: Diagnosis present

## 2022-11-23 DIAGNOSIS — R278 Other lack of coordination: Secondary | ICD-10-CM | POA: Diagnosis present

## 2022-11-23 DIAGNOSIS — M6289 Other specified disorders of muscle: Secondary | ICD-10-CM | POA: Insufficient documentation

## 2022-11-23 NOTE — Therapy (Signed)
OUTPATIENT PHYSICAL THERAPY FEMALE PELVIC DISCHARGE   Patient Name: Yvette Tran MRN: [id number] DOB:[birth year], 21 y.o., female Today's Date: 11/23/2022   PT End of Session - 11/23/22 1403     Visit Number 8    Number of Visits 10    Date for PT Re-Evaluation 12/05/22    Authorization Type IE: 09/26/22    PT Start Time 1400    PT Stop Time 1420    PT Time Calculation (min) 20 min    Activity Tolerance Patient tolerated treatment well             History reviewed. No pertinent past medical history. Past Surgical History:  Procedure Laterality Date   APPENDECTOMY     Patient Active Problem List   Diagnosis Date Noted   Postpartum hemorrhage, delivered 08/01/2022   Acute blood loss anemia 08/01/2022   Uterine contractions 07/30/2022   Encounter for supervision of normal pregnancy in third trimester 01/26/2022    PCP: None per Pt  REFERRING PROVIDER: Gertie Fey, CNM   REFERRING DIAG:  O70.20 (ICD-10-CM) - Third degree perineal laceration during delivery, unspecified   THERAPY DIAG:  Pelvic floor dysfunction  Muscle weakness (generalized)  Other lack of coordination  Rationale for Evaluation and Treatment: Rehabilitation  ONSET DATE: After birth in August                                                                                                                                                                                          PRECAUTIONS: None  WEIGHT BEARING RESTRICTIONS: No  FALLS:  Has patient fallen in last 6 months? No  OCCUPATION/SOCIAL ACTIVITIES: Staying at home with 70 month old, walking, hiking trails, reading, baking/cooking, would like to return to gym   PLOF: Independent   CHIEF CONCERN: Pt was recommended to PFPT. Pt has not returned to sexual activity and currently has hemorrhoids. Pt feels discomfort/pain initially when having a BM.    PATIENT GOALS: Pt would like not to have bowel leakage, be more confident  and educational about her body postpartum    UROLOGICAL HISTORY  Fluid intake: water, Body Armour, smoothies   Pain with urination: No Fully empty bladder: No Stream: Strong Urgency: No Frequency: every 3 hrs Nocturia: 0x - but hard to determine due to breastfeeding during the night  Leakage:  None Pads: No   GASTROINTESTINAL HISTORY Pain with bowel movement: Yes Type of bowel movement:Type (Bristol Stool Scale) - on average Type 4 and Strain No Frequency: 1x/day  Fully empty rectum: No Leakage: Yes, 3-4x/week having to change underwear    SEXUAL HISTORY/FUNCTION Pain with intercourse: During Penetration, After  Intercourse, and Pain Interrupts Intercourse Ability to have vaginal penetration:  Yes: has not tried since giving birth Able to achieve orgasm?: Yes  OBSTETRICAL HISTORY Vaginal deliveries: G1P1 Tearing: Yes: Grade 3 tear  C-section deliveries: none Currently pregnant: No  GYNECOLOGICAL HISTORY Hysterectomy: no Pelvic Organ Prolapse: None Pain with exam: - has not had Papsmear done  Heaviness/pressure: no  SUBJECTIVE:  Pt has been feeling really well. Pt has started to feel like herself again and not having any pelvic floor dysfunction.     PAIN:  Are you having pain? No    OBJECTIVE:    COGNITION: Overall cognitive status: Within functional limits for tasks assessed     POSTURE:   Iliac crest height: L iliac crest, L shoulder elevated Pelvic obliquity: WNL   RANGE OF MOTION:   (Norm range in degrees)  LEFT 10/02/22 RIGHT 10/02/22  Lumbar forward flexion (65):  WNL    Lumbar extension (30): WNL    Lumbar lateral flexion (25):  WNL WNL  Thoracic and Lumbar rotation (30 degrees):    WNL Restricted  Hip Flexion (0-125):   WNL WNL  Hip IR (0-45):  WNL WNL  Hip ER (0-45):  WNL WNL  Hip Adduction:      Hip Abduction (0-40):  WNL WNL  Hip extension (0-15):     (*= pain, Blank rows = not tested)   STRENGTH: MMT    RLE 10/02/22  LLE 10/02/22  Hip Flexion 4 4  Hip Extension 5 5  Hip Abduction     Hip Adduction     Hip ER  5 5  Hip IR  5 5  Knee Extension 5 5  Knee Flexion 5 5  Dorsiflexion     Plantarflexion (seated) 5 5  (*= pain, Blank rows = not tested)   SPECIAL TESTS:   FABER (SN 81): negative B  FADIR (SN 94): negative B   PALPATION:  Abdominal:  Diastasis: none Scar mobility: present, at perineum - not observed but subjective  Rib flare: none  -R middle of abdomen with increased tenderness upon palpation   EXTERNAL PELVIC EXAM: Patient educated on the purpose of the pelvic exam and articulated understanding; patient consented to the exam verbally. Breath coordination: present but inconsistent Voluntary Contraction: present, 3/5 MMT Relaxation: full Perineal movement with sustained IAP increase ("bear down"): descent but with Valsalva maneuver  Perineal movement with rapid IAP increase ("cough"): no change (0= no contraction, 1= flicker, 2= weak squeeze, 3= fair squeeze with lift, 4= good squeeze and lift against resistance, 5= strong squeeze against strong resistance)   INTERNAL PELVIC EXAM: Patient educated on the purpose of the pelvic exam and articulated understanding; patient consented to the exam verbally.  Introitus Appears: WNL Skin integrity: WNL, some red bumps at mons pubis but due to ingrown hairs per Pt Scar mobility: Grade 3 perineal scar above anal opening  Significant scar restriction at center of scar below vaginal opening (increased burning sensation and sore) Increased pain when palpated to the L  Sensation: WNLs with light touch   TODAY'S TREATMENT  Neuromuscular Re-education: Reassessment of FOTO: FOTO Urinary Problem - IE: 75, Today: 74 (-1); however, based on FOTO responses and subjective hx demonstrated: Pt is fully confident in bladder control and no difficulty at home or in the community  FOTO Bowel Leakage - IE: 55, Today: 69  Discussion and reviewed goals  below for discharge: Pt met 4/6 goals created from IE    EXTERNAL PELVIC EXAM: Patient  educated on the purpose of the pelvic exam and articulated understanding; patient consented to the exam verbally. Breath coordination: fully present, (IE: present but inconsistent) Voluntary Contraction: present, 3/5 MMT  POSTURE:   Iliac crest height: equal B and no shoulder elevation, (IE: L iliac crest, L shoulder elevated) Pelvic obliquity: WNL  Discussion on PFM strength and how to progress as part of HEP with thinking up to 2-44 quick flicks but allowing strength to build over 6-8 weeks.   Discussion on using diaphragmatic breathing techniques/relaxation and PFM lengthening techniques during visit to annual well women exam to help decrease PFM tension and allow for successful Papsmear. Pt verbalized understanding.    Patient response to interventions: Pt is requesting to discharge for financial reasons and due to traveling over the next few weeks.    Patient Education:  Patient provided with HEP including: PFM strengthening HEP. Patient educated throughout session on appropriate technique and form using multi-modal cueing, HEP, and activity modification. Patient will benefit from further education in order to maximize compliance and understanding for long-term therapeutic gains.     ASSESSMENT:  Clinical Impression: Patient presents to clinic with excellent motivation to participate in today's discharge. Pt has made significant progress since IE on 09/26/22 in relation to IAP management, PFM coordination, posture, pain, and scar mobility. Based on FOTO responses, Pt met goal of 69 (IE: 50) which demonstrates no difficulty with bowel leakage or instances of bowel leakage since IE. Pt has not had to change undergarments (IE: 3-4x per week due to bowel leakage) nor has had SUI episodes as well. Although FOTO Urinary Problem does not demonstrate a change, based on FOTO responses and subjective  report, Pt has had no problems with SUI and is fully confident in her bladder control. Upon PFM external exam, Pt has fully present breath coordination and continues to have 3/5 MMT PFM strength. Pt's posture also continues to be level compared to IE (L iliac crest higher and L shoulder elevated). Pt has learned about perineal scar massage and has been able to continue practicing which has helped alleviate pain especially during penetrative sex. In addition, Pt reports learning a lot about their body postpartum. Pt has not returned for an annual GYN/Papsmear exam but DPT advised to use diaphragmatic breathing/relaxation techniques and PFM lengthening techniques at visit to help reduce discomfort and allow for a successful well women exam. Pt verbalized understanding.  Pt has met 4/6 goals below and agrees/is adequate for discharge. Pt given rehab services card if further questions arise or if Pt would like to return to PFPT.    Objective Impairments: decreased coordination, decreased strength, increased fascial restrictions, improper body mechanics, postural dysfunction, and pain.   Activity Limitations: carrying, lifting, squatting, continence, toileting, and caring for others  Personal Factors: 1 comorbidity: postpartum depression  are also affecting patient's functional outcome.   Rehab Potential: Good  Clinical Decision Making: Evolving/moderate complexity  Evaluation Complexity: Moderate   GOALS: Goals reviewed with patient? Yes  SHORT TERM GOALS: Target date: 10/31/2022  Patient will demonstrate independence with HEP in order to maximize therapeutic gains and improve carryover from physical therapy sessions to ADLs in the home and community. Baseline: toileting posture handout/scar mobilization handout; (12/7): Pt is IND with HEP  Goal status: MET    LONG TERM GOALS: Target date: 12/05/2022   Patient will demonstrate circumferential and sequential contraction of >3/5 MMT, > 5 sec  hold x5 and 5 consecutive quick flicks with </= 10 min rest between testing  bouts, and relaxation of the PFM coordinated with breath for improved management of intra-abdominal pressure and normal bowel and bladder function without the presence of pain nor incontinence in order to improve participation at home and in the community. Baseline: 3/5 MMT/no endurance tested; (12/7): present fully breath coordination, 3/5 MMT, no endurance tested Goal status: NOT MET  2.  Patient will demonstrate coordinated lengthening and relaxation of PFM with diaphragmatic inhalation in order to decrease spasm and allow for unrestricted elimination of urine/feces for improved overall QOL. Baseline: has hemorrhoids and pain initially with a BM/toileting posture reviewed, present but inconsistent breath; (12/7): good BMs, not as much urinary frequency, full breath coordination present Goal status: MET  3.  Patient will report decreased frequency of having to change undergarments as indicated by a 24 hour period to demonstrate improved bowel control/improved PFM coordination and allow for increased participation in activities outside of the home. Baseline: 3-4x/week changing underwear due to bowel leakage; (12/7): Pt has not had any bowel leakage and not having to change underwear  Goal status: MET   4.  Patient will report being able to return to activities including, but not limited to: penetrative sex, ability to participate in annual GYN exams/Papsmear, hiking/increased physical activity without pain or limitation to indicate complete resolution of one of the Pt's concerns and return to prior level of participation at home and in the community. Baseline: before pregnancy having pain with penetrative sex and unable to follow through with Papsmear due to pain, is walking now but has not returned to gym; (12/7): Pt just walked a 5k with no limitation, no pain with penetrative sex/Pt has not had Papsmear since IE  Goal  status: MET  5.  Patient will score >/= 83 and 69 on FOTO Urinary Problem and Bowel Leakage  in order to demonstrate improved IAP management, improved PFM coordination, and overall QOL.  Baseline: 75 and 55; 74 on Urinary and 69 on Bowel Goal status: NOT MET    PLAN: PT Frequency: 1x/week  PT Duration: 10 weeks  Planned Interventions: Therapeutic exercises, Therapeutic activity, Neuromuscular re-education, Balance training, Gait training, Patient/Family education, Self Care, Joint mobilization, Spinal mobilization, Cryotherapy, Moist heat, scar mobilization, Taping, and Manual therapy     Teoman Giraud, PT, DPT  11/23/2022, 2:41 PM

## 2022-11-27 ENCOUNTER — Ambulatory Visit: Payer: BC Managed Care – PPO

## 2022-12-04 ENCOUNTER — Ambulatory Visit: Payer: BC Managed Care – PPO

## 2022-12-12 ENCOUNTER — Ambulatory Visit: Payer: BC Managed Care – PPO

## 2023-08-13 DIAGNOSIS — Z3493 Encounter for supervision of normal pregnancy, unspecified, third trimester: Secondary | ICD-10-CM | POA: Insufficient documentation

## 2023-08-23 LAB — OB RESULTS CONSOLE RPR: RPR: NONREACTIVE

## 2023-08-23 LAB — OB RESULTS CONSOLE VARICELLA ZOSTER ANTIBODY, IGG: Varicella: IMMUNE

## 2023-08-23 LAB — OB RESULTS CONSOLE RUBELLA ANTIBODY, IGM: Rubella: IMMUNE

## 2023-08-23 LAB — OB RESULTS CONSOLE GC/CHLAMYDIA
Chlamydia: NEGATIVE
Neisseria Gonorrhea: NEGATIVE

## 2023-08-23 LAB — OB RESULTS CONSOLE HEPATITIS B SURFACE ANTIGEN: Hepatitis B Surface Ag: NEGATIVE

## 2023-08-23 LAB — HEPATITIS C ANTIBODY: HCV Ab: NEGATIVE

## 2023-08-23 LAB — OB RESULTS CONSOLE HIV ANTIBODY (ROUTINE TESTING): HIV: NONREACTIVE

## 2023-11-19 LAB — OB RESULTS CONSOLE RPR: RPR: NONREACTIVE

## 2023-12-05 DIAGNOSIS — Z9289 Personal history of other medical treatment: Secondary | ICD-10-CM

## 2023-12-05 DIAGNOSIS — Z8759 Personal history of other complications of pregnancy, childbirth and the puerperium: Secondary | ICD-10-CM

## 2023-12-19 NOTE — L&D Delivery Note (Signed)
Delivery Note  Yvette Tran Plemmons is a [id number] at [redacted]w[redacted]d, Patient's last menstrual period was 05/06/2023 (exact date)., consistent with [place] at [redacted]w[redacted]d. Estimated Date of Delivery: 02/10/24   First Stage: Labor onset: 1600 Augmentation: AROM Analgesia Eliezer Lofts intrapartum: Epidural AROM at 0237 GBS: negative  Second Stage: Complete dilation at 0648 Onset of pushing at 0717 FHR second stage 135 bpm with moderate variability, accels present, intermittent variables decels with pushing   Yvette Tran presented to L&D with early labor. She was initially expectantly managed. Contractions decreased in frequency after epidural. AROM was performed for augmentation of labor. She progressed well to C/C/+3 with an urge to push.  She pushed effectively over approximately 10 minutes for a spontaneous vaginal birth.  Delivery of a viable baby girl on 02/06/2023 at 0726 by CNM Delivery of fetal head in OA position with restitution to LOT. Loose nuchal cord x 1, easily reduced;  Anterior then posterior shoulders delivered easily with gentle downward traction. Baby placed on mom's chest, and attended to by baby RN Cord double clamped after cessation of pulsation, cut by father of baby  Cord blood sample collection: Yes O POS  Third Stage: Oxytocin bolus started after delivery of infant for hemorrhage prophylaxis  Placenta delivered via Tomasa Blase mechanism intact with 3 VC @ [phone] Placenta disposition: discarded  Uterine tone firm / bleeding small  Laceration: 2nd degree perineal  Anesthesia for repair: Epidural Suture: 2.0 vicryl CT Est. Blood Loss (mL): 200 ml   Complications: None  Mom to postpartum.  Baby to Couplet care / Skin to Skin.  Newborn: Information for the patient's newborn:  Guarnieri, Girl Yvette Tran [[id number]]  Live born female "Windell Moulding" Birth Weight:  Pending  APGAR: 9, 9  Newborn Delivery   Birth date/time: 02/07/2024 07:26:00 Delivery type: Vaginal, Spontaneous      Feeding planned:  breast feeding  ---------- Margaretmary Eddy, CNM Certified Nurse Midwife [facility]

## 2023-12-26 IMAGING — US US ABDOMEN LIMITED
1 series · 14 of 25 positions shown · non-contrast
Comparison: None.

CLINICAL DATA: Epigastric pain tonight. Eighteen weeks pregnant
currently.

EXAM:
ULTRASOUND ABDOMEN LIMITED RIGHT UPPER QUADRANT

[Series 1: us abdomen limited ruq (liver/gb) · 14 of 39 slices shown]
[im 1/39]
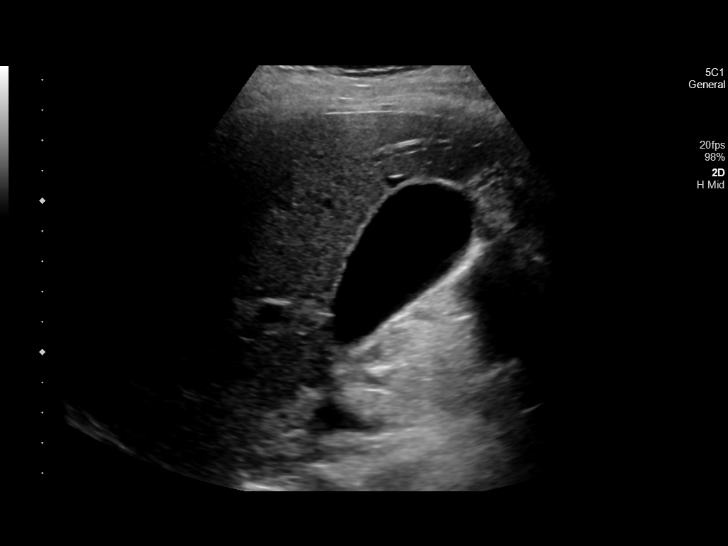
[im 4/39]
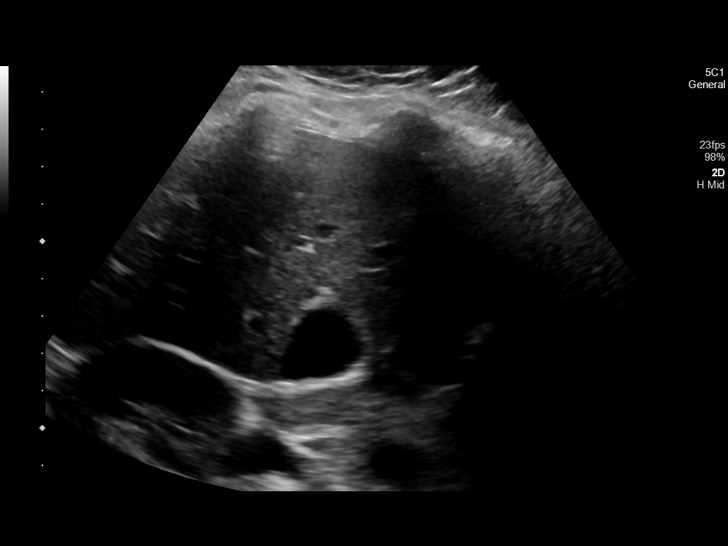
[im 7/39]
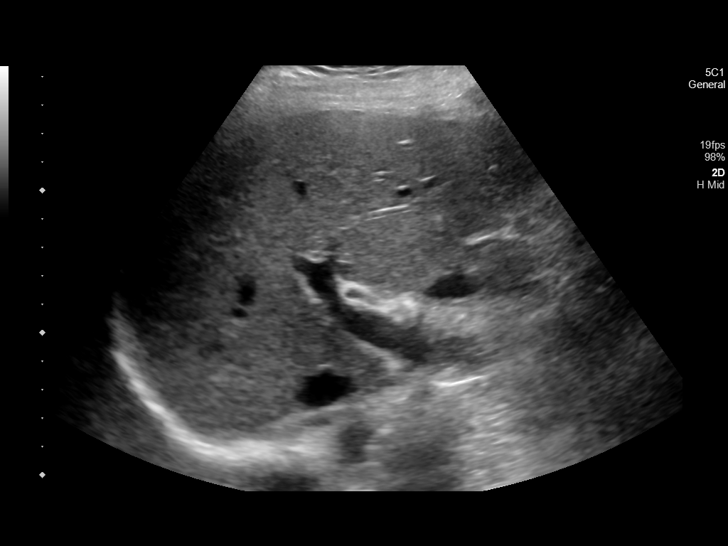
[im 10/39]
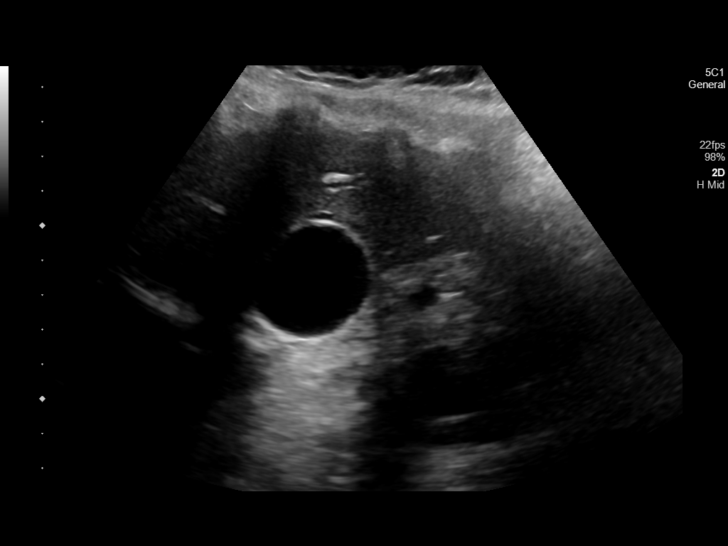
[im 13/39]
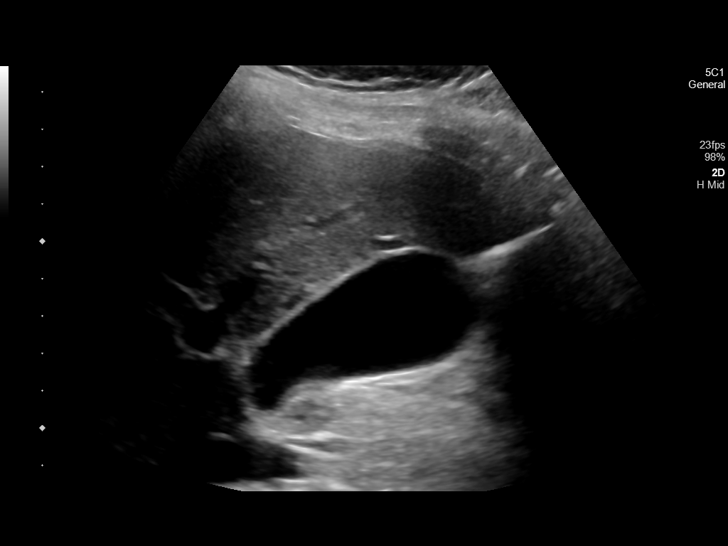
[im 15/39]
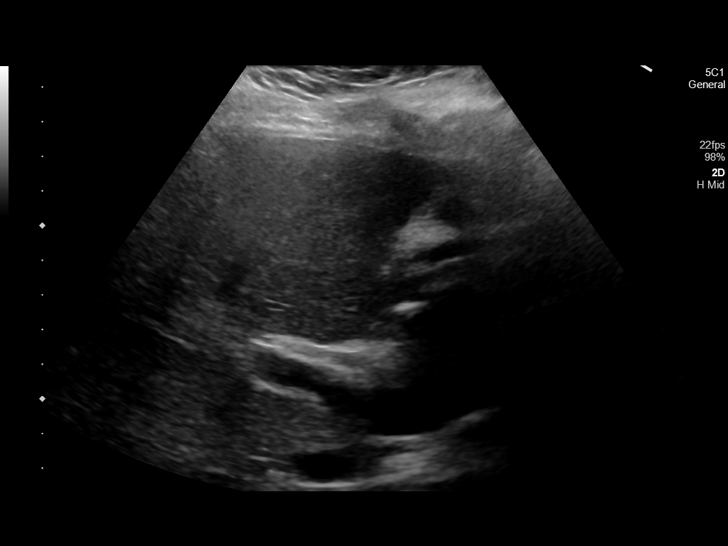
[im 18/39]
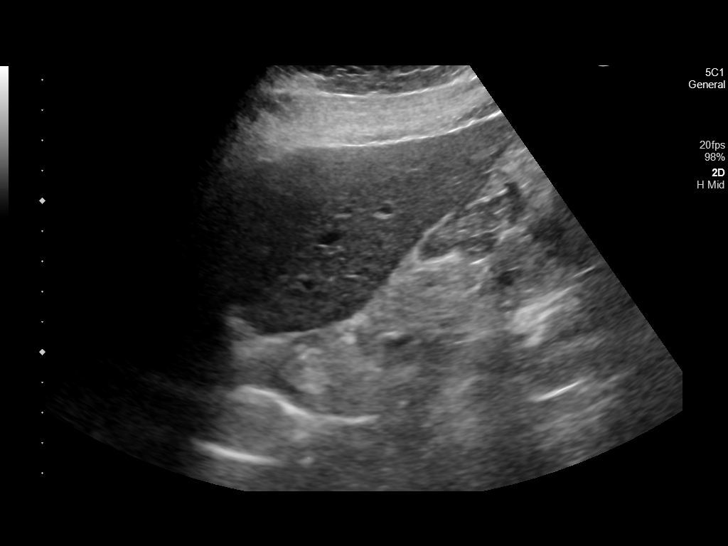
[im 21/39]
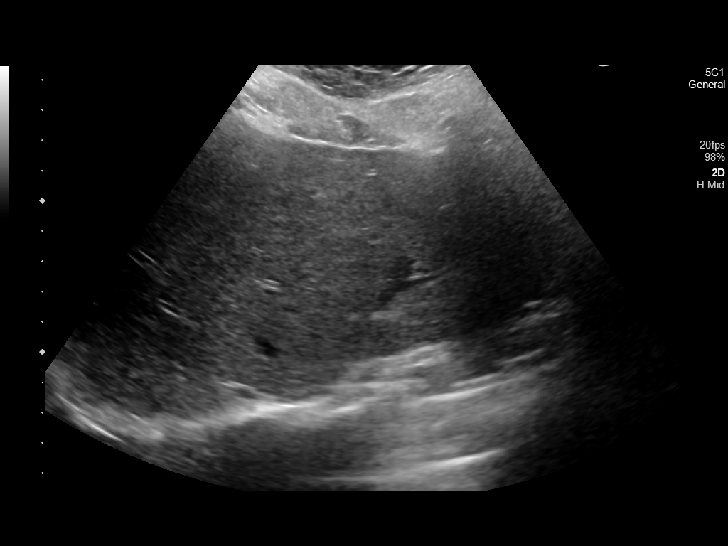
[im 24/39]
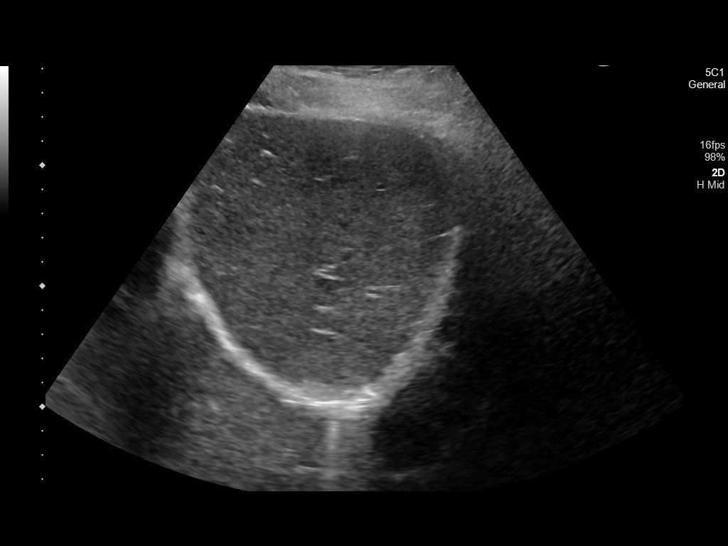
[im 26/39]
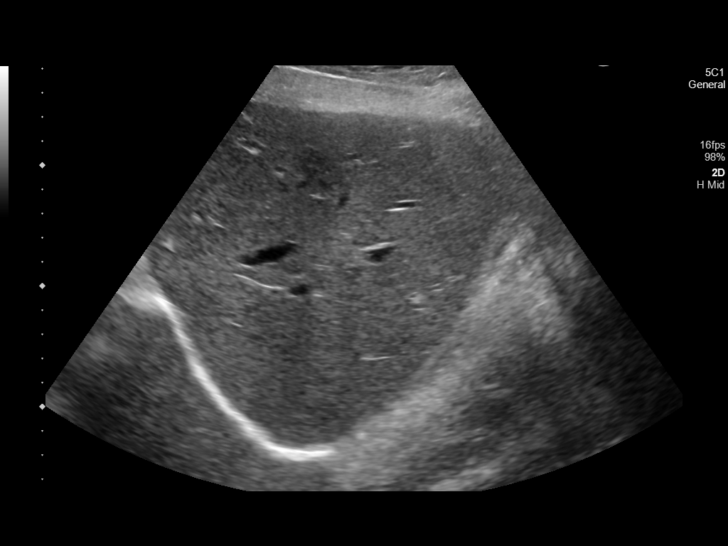
[im 29/39]
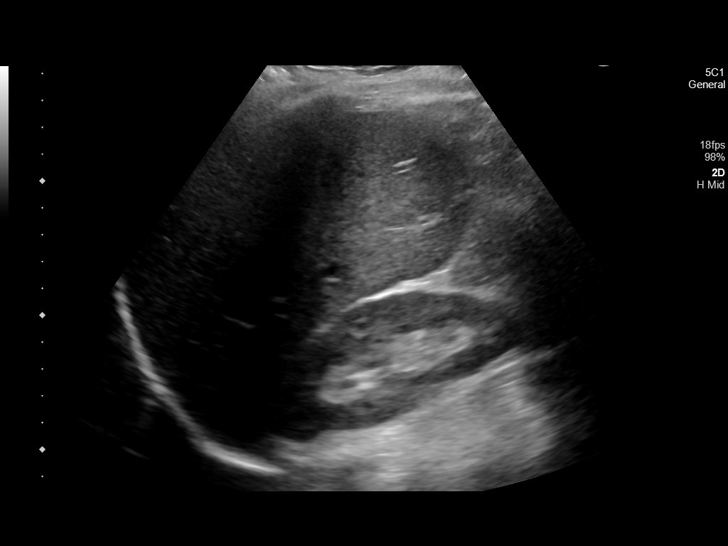
[im 32/39]
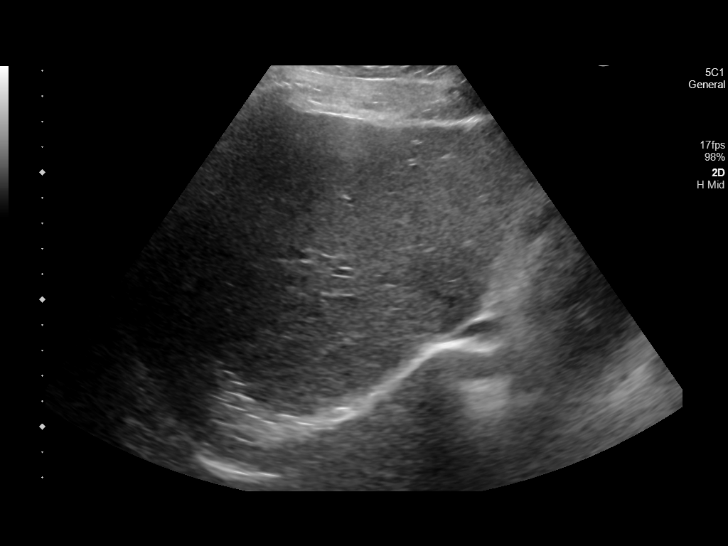
[im 35/39]
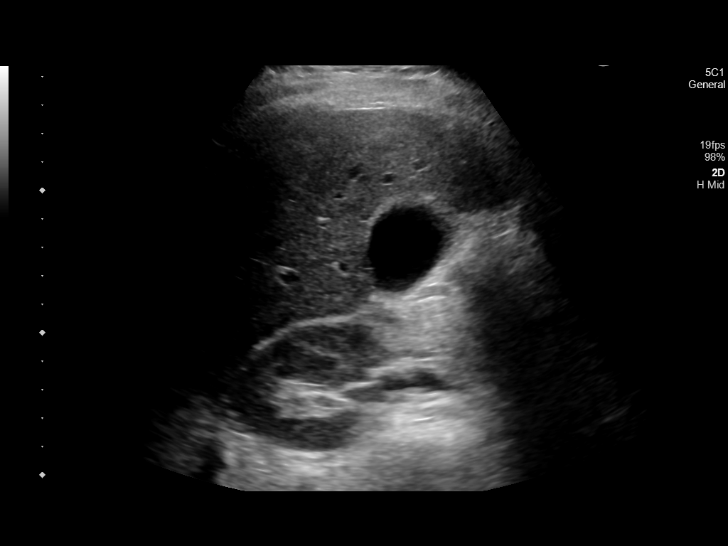
[im 39/39]
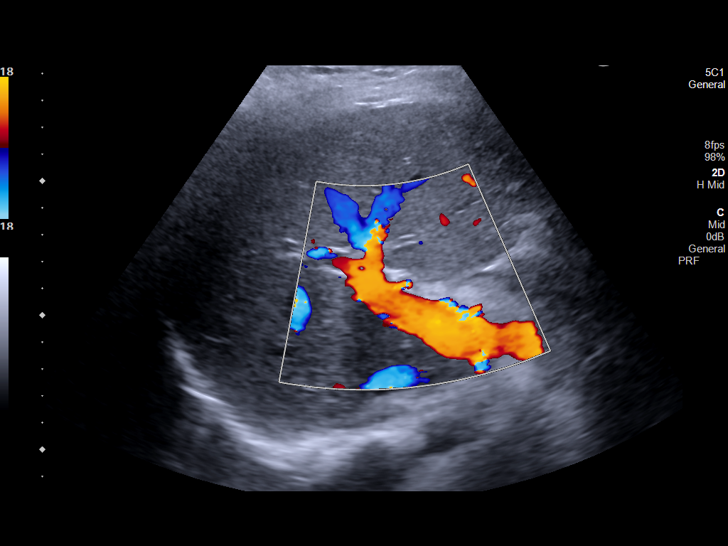

[14 of 25 positions shown; findings below may reference images not displayed]

FINDINGS: Gallbladder:

No gallstones or wall thickening visualized. No sonographic Murphy
sign noted by sonographer.

Common bile duct:

Diameter: 2.5 mm, normal

Liver:

No focal lesion identified. Within normal limits in parenchymal
echogenicity. Portal vein is patent on color Doppler imaging with
normal direction of blood flow towards the liver.

Other: None.
IMPRESSION: Normal examination. No evidence of cholelithiasis or acute
cholecystitis.

## 2024-01-14 DIAGNOSIS — O09293 Supervision of pregnancy with other poor reproductive or obstetric history, third trimester: Secondary | ICD-10-CM

## 2024-01-14 LAB — OB RESULTS CONSOLE GC/CHLAMYDIA
Chlamydia: NEGATIVE
Neisseria Gonorrhea: NEGATIVE

## 2024-01-14 LAB — OB RESULTS CONSOLE HIV ANTIBODY (ROUTINE TESTING): HIV: NONREACTIVE

## 2024-01-14 LAB — OB RESULTS CONSOLE GBS: GBS: NEGATIVE

## 2024-02-06 ENCOUNTER — Encounter: Payer: Self-pay | Admitting: Obstetrics and Gynecology

## 2024-02-06 ENCOUNTER — Inpatient Hospital Stay
Admission: EM | Admit: 2024-02-06 | Discharge: 2024-02-08 | DRG: 807 | Disposition: A | Discharge status: Home / Self Care | Payer: BC Managed Care – PPO | Attending: Obstetrics and Gynecology | Admitting: Obstetrics and Gynecology

## 2024-02-06 ENCOUNTER — Inpatient Hospital Stay: Payer: BC Managed Care – PPO | Admitting: Anesthesiology

## 2024-02-06 ENCOUNTER — Other Ambulatory Visit: Payer: Self-pay

## 2024-02-06 DIAGNOSIS — O26893 Other specified pregnancy related conditions, third trimester: Secondary | ICD-10-CM | POA: Diagnosis present

## 2024-02-06 DIAGNOSIS — Z3A39 39 weeks gestation of pregnancy: Secondary | ICD-10-CM | POA: Diagnosis not present

## 2024-02-06 DIAGNOSIS — O9962 Diseases of the digestive system complicating childbirth: Secondary | ICD-10-CM | POA: Diagnosis present

## 2024-02-06 DIAGNOSIS — O09293 Supervision of pregnancy with other poor reproductive or obstetric history, third trimester: Secondary | ICD-10-CM

## 2024-02-06 DIAGNOSIS — K219 Gastro-esophageal reflux disease without esophagitis: Secondary | ICD-10-CM | POA: Diagnosis present

## 2024-02-06 DIAGNOSIS — O479 False labor, unspecified: Principal | ICD-10-CM | POA: Diagnosis present

## 2024-02-06 DIAGNOSIS — Z9289 Personal history of other medical treatment: Secondary | ICD-10-CM

## 2024-02-06 DIAGNOSIS — Z8759 Personal history of other complications of pregnancy, childbirth and the puerperium: Secondary | ICD-10-CM

## 2024-02-06 LAB — CBC
HCT: 35.9 % — ABNORMAL LOW (ref 36.0–46.0)
Hemoglobin: 12.6 g/dL (ref 12.0–15.0)
MCH: 28.3 pg (ref 26.0–34.0)
MCHC: 35.1 g/dL (ref 30.0–36.0)
MCV: 80.5 fL (ref 80.0–100.0)
Platelets: 324 10*3/uL (ref 150–400)
RBC: 4.46 MIL/uL (ref 3.87–5.11)
RDW: 13.4 % (ref 11.5–15.5)
WBC: 9.1 10*3/uL (ref 4.0–10.5)
nRBC: 0 % (ref 0.0–0.2)

## 2024-02-06 LAB — TYPE AND SCREEN
ABO/RH(D): O POS
Antibody Screen: NEGATIVE

## 2024-02-06 LAB — RUPTURE OF MEMBRANE (ROM)PLUS: Rom Plus: NEGATIVE

## 2024-02-06 MED ORDER — PHENYLEPHRINE 80 MCG/ML (10ML) SYRINGE FOR IV PUSH (FOR BLOOD PRESSURE SUPPORT): 80.0000 ug | PREFILLED_SYRINGE | INTRAVENOUS | Status: DC | PRN

## 2024-02-06 MED ORDER — SODIUM CHLORIDE 0.9% FLUSH: 3.0000 mL | INTRAVENOUS | Status: DC | PRN

## 2024-02-06 MED ORDER — SODIUM CHLORIDE 0.9 % IV SOLN
INTRAVENOUS | Status: DC | PRN
  Administered 2024-02-06 (×2): 5 mL via EPIDURAL

## 2024-02-06 MED ORDER — LACTATED RINGERS IV SOLN: 500.0000 mL | INTRAVENOUS | Status: DC | PRN

## 2024-02-06 MED ORDER — DIPHENHYDRAMINE HCL 50 MG/ML IJ SOLN: 12.5000 mg | INTRAMUSCULAR | Status: DC | PRN

## 2024-02-06 MED ORDER — LACTATED RINGERS IV SOLN
500.0000 mL | Freq: Once | INTRAVENOUS | Status: AC
  Administered 2024-02-06: 500 mL via INTRAVENOUS

## 2024-02-06 MED ORDER — LIDOCAINE HCL (PF) 1 % IJ SOLN
INTRAMUSCULAR | Status: DC | PRN
  Administered 2024-02-06: 3 mL via SUBCUTANEOUS

## 2024-02-06 MED ORDER — ACETAMINOPHEN 500 MG PO TABS: 1000.0000 mg | ORAL_TABLET | Freq: Four times a day (QID) | ORAL | Status: DC | PRN

## 2024-02-06 MED ORDER — SOD CITRATE-CITRIC ACID 500-334 MG/5ML PO SOLN: 30.0000 mL | ORAL | Status: DC | PRN

## 2024-02-06 MED ORDER — CALCIUM CARBONATE ANTACID 500 MG PO CHEW
2.0000 | CHEWABLE_TABLET | ORAL | Status: DC | PRN
  Administered 2024-02-07: 400 mg via ORAL
  Filled 2024-02-06: qty 2

## 2024-02-06 MED ORDER — OXYTOCIN BOLUS FROM INFUSION
333.0000 mL | Freq: Once | INTRAVENOUS | Status: AC
  Administered 2024-02-07: 333 mL via INTRAVENOUS

## 2024-02-06 MED ORDER — LIDOCAINE HCL (PF) 1 % IJ SOLN: 30.0000 mL | INTRAMUSCULAR | Status: DC | PRN

## 2024-02-06 MED ORDER — FENTANYL-BUPIVACAINE-NACL 0.5-0.125-0.9 MG/250ML-% EP SOLN
12.0000 mL/h | EPIDURAL | Status: DC | PRN
  Administered 2024-02-06: 12 mL/h via EPIDURAL

## 2024-02-06 MED ORDER — ZOLPIDEM TARTRATE 5 MG PO TABS: 5.0000 mg | ORAL_TABLET | Freq: Every evening | ORAL | Status: DC | PRN

## 2024-02-06 MED ORDER — FENTANYL CITRATE (PF) 100 MCG/2ML IJ SOLN: 50.0000 ug | INTRAMUSCULAR | Status: DC | PRN

## 2024-02-06 MED ORDER — ONDANSETRON HCL 4 MG/2ML IJ SOLN: 4.0000 mg | Freq: Four times a day (QID) | INTRAMUSCULAR | Status: DC | PRN

## 2024-02-06 MED ORDER — EPHEDRINE 5 MG/ML INJ: 10.0000 mg | INTRAVENOUS | Status: DC | PRN

## 2024-02-06 MED ORDER — LIDOCAINE-EPINEPHRINE (PF) 1.5 %-1:200000 IJ SOLN
INTRAMUSCULAR | Status: DC | PRN
  Administered 2024-02-06: 3 mL via EPIDURAL

## 2024-02-06 MED ORDER — SODIUM CHLORIDE 0.9% FLUSH: 3.0000 mL | Freq: Two times a day (BID) | INTRAVENOUS | Status: DC

## 2024-02-06 MED ORDER — SODIUM CHLORIDE 0.9 % IV SOLN: 250.0000 mL | INTRAVENOUS | Status: DC | PRN

## 2024-02-06 MED ORDER — FENTANYL-BUPIVACAINE-NACL 0.5-0.125-0.9 MG/250ML-% EP SOLN
EPIDURAL | Status: AC
  Filled 2024-02-06: qty 250

## 2024-02-06 MED ORDER — LACTATED RINGERS IV SOLN: INTRAVENOUS | Status: DC

## 2024-02-06 MED ORDER — OXYTOCIN-SODIUM CHLORIDE 30-0.9 UT/500ML-% IV SOLN
2.5000 [IU]/h | INTRAVENOUS | Status: DC
  Administered 2024-02-07: 2.5 [IU]/h via INTRAVENOUS
  Filled 2024-02-06: qty 500

## 2024-02-06 NOTE — H&P (Signed)
OB History & Physical   History of Present Illness:   Chief Complaint: contractions   HPI:  Yvette Tran is a 23 y.o. G2P1001 female at [redacted]w[redacted]d, Patient's last menstrual period was 05/06/2023 (exact date)., consistent with [place] at [redacted]w[redacted]d, with Estimated Date of Delivery: 02/10/24.  She presents to L&D for contractions that stated around 1600. She felt a gush of fluid and had a small amount of leaking. Has not had leaking since. ROM plus was negative in OB triage.  Her pregnancy is complicated by history of 3rd degree lac and PPH requiring blood transfusion (from lacerations) .  She denies continued Loss of fluid or Vaginal bleeding. Endorses fetal movement as active.   Reports active fetal movement  Contractions: every 5 to 7 minutes starting at 1600 LOF/SROM: Rom + negative  Vaginal bleeding: bloody show   Factors complicating pregnancy:  Principal Problem:   Uterine contractions during pregnancy Active Problems:   History of blood transfusion   History of maternal third degree perineal laceration, currently pregnant in third trimester   History of postpartum hemorrhage   Normal labor    Prenatal care site:  [facility]  Patient Active Problem List   Diagnosis Date Noted   Uterine contractions during pregnancy 02/06/2024   Normal labor 02/06/2024   History of maternal third degree perineal laceration, currently pregnant in third trimester 01/14/2024   History of blood transfusion 12/05/2023   History of postpartum hemorrhage 12/05/2023   Encounter for supervision of normal pregnancy in third trimester 08/13/2023    Prenatal Transfer Tool  Maternal Diabetes: No Genetic Screening: Declined Maternal Ultrasounds/Referrals: Normal Fetal Ultrasounds or other Referrals:  None Maternal Substance Abuse:  No Significant Maternal Medications:  None Significant Maternal Lab Results: Group B Strep negative  Maternal Medical History:   Past Medical History:  Diagnosis Date    Postpartum hemorrhage, delivered 08/01/2022    Past Surgical History:  Procedure Laterality Date   APPENDECTOMY      No Known Allergies  Prior to Admission medications   Medication Sig Start Date End Date Taking? Authorizing Provider  acetaminophen (TYLENOL) 325 MG tablet Take 2 tablets (650 mg total) by mouth every 4 (four) hours as needed (for pain scale < 4). Patient not taking: Reported on 09/26/2022 08/02/22   Chari Manning, CNM  benzocaine-Menthol (DERMOPLAST) 20-0.5 % AERO Apply 1 Application topically as needed for irritation (perineal discomfort). Patient not taking: Reported on 09/26/2022 08/02/22   Chari Manning, CNM  coconut oil OIL Apply 1 Application topically as needed. Patient not taking: Reported on 09/26/2022 08/02/22   Chari Manning, CNM  dibucaine (NUPERCAINAL) 1 % OINT Place 1 Application rectally as needed for hemorrhoids. Patient not taking: Reported on 09/26/2022 08/02/22   Chari Manning, CNM  docusate sodium (COLACE) 100 MG capsule Take 1 capsule (100 mg total) by mouth 2 (two) times daily. Patient not taking: Reported on 09/26/2022 08/02/22   Chari Manning, CNM  ferrous sulfate 325 (65 FE) MG tablet Take 1 tablet (325 mg total) by mouth 2 (two) times daily with a meal. Patient not taking: Reported on 09/26/2022 08/02/22   Chari Manning, CNM  ibuprofen (ADVIL) 600 MG tablet Take 1 tablet (600 mg total) by mouth every 6 (six) hours. Patient not taking: Reported on 09/26/2022 08/02/22   Chari Manning, CNM  Prenatal Vit-Fe Fumarate-FA (MULTIVITAMIN-PRENATAL) 27-0.8 MG TABS tablet Take 1 tablet by mouth daily at 12 noon. Patient not taking: Reported on 09/26/2022    [provider]  senna-docusate (  SENOKOT-S) 8.6-50 MG tablet Take 2 tablets by mouth daily. Patient not taking: Reported on 09/26/2022 08/03/22   Chari Manning, CNM  sertraline (ZOLOFT) 50 MG tablet Take 50 mg by mouth daily.    [provider]   simethicone (MYLICON) 80 MG chewable tablet Chew 1 tablet (80 mg total) by mouth as needed for flatulence. Patient not taking: Reported on 09/26/2022 08/02/22   Chari Manning, CNM  witch hazel-glycerin (TUCKS) pad Apply 1 Application topically as needed for hemorrhoids. Patient not taking: Reported on 09/26/2022 08/02/22   Chari Manning, CNM    OB History  Gravida Para Term Preterm AB Living  2 1 1  0 0 1  SAB IAB Ectopic Multiple Live Births  0 0 0 0 1    # Outcome Date GA Lbr Len/2nd Weight Sex Type Anes PTL Lv  2 Current           1 Term 07/31/22 [redacted]w[redacted]d / 02:40 3510 g M Vag-Spont EPI  LIV     Name: Schnelle,BOY Yvette     Apgar1: 9  Apgar5: 9     Social History: She  reports that she has never smoked. She has never used smokeless tobacco. She reports that she does not drink alcohol and does not use drugs.  Family History: family history is not on file.   Review of Systems: A full review of systems was performed and negative except as noted in the HPI.     Physical Exam:  Vital Signs: BP 116/72 (BP Location: Right Arm)   Pulse 90   Temp 98.1 F (36.7 C) (Oral)   Resp 19   LMP 05/06/2023 (Exact Date)   SpO2 99%   General: no acute distress.  HEENT: normocephalic, atraumatic Heart: regular rate & rhythm Lungs: normal respiratory effort Abdomen: soft, gravid, non-tender;  EFW: 7 lbs  Pelvic:   External: Normal external female genitalia  Cervix: Dilation: 4 / Effacement (%): 70 / Station: -2    Extremities: non-tender, symmetric, no edema bilaterally.  DTRs: 2+/2+  Neurologic: Alert & oriented x 3.    Results for orders placed or performed during the hospital encounter of 02/06/24 (from the past 24 hours)  Rupture of Membrane (ROM) Plus     Status: None   Collection Time: 02/06/24  7:55 PM  Result Value Ref Range   Rom Plus NEGATIVE     Pertinent Results:  Prenatal Labs: Blood type/Rh O POS Performed at [facility], [address]  [place], [place] [address]    Antibody screen Negative    Rubella Immune (09/05 0000)   Varicella Immune  RPR Nonreactive (12/02 0000)   HBsAg Negative (09/05 0000)  Hep C NR   HIV Non-reactive (01/27 0000)   GC neg  Chlamydia neg  Genetic screening Declined   1 hour GTT 106  3 hour GTT N/A  GBS Negative/-- (01/27 0000)    FHT:  FHR: 130 bpm, variability: moderate,  accelerations:  Present,  decelerations:  Absent Category/reactivity:  Category I UC:   regular, every 3-5 minutes   Cephalic by Leopolds and SVE   No results found.  Assessment:  Yvette Tran is a 23 y.o. G47P1001 female at [redacted]w[redacted]d with early labor.   Plan:  1. Admit to Labor & Delivery - Admission status: Inpatient - Dr Beverly Gust MD notified of admission and plan of care  - Reason for admission: labor management - consents reviewed and obtained  2. Fetal Well being  - Fetal Tracing:  cat 1 - Group B Streptococcus ppx not indicated: GBS negative - Presentation: cephalic confirmed by SVE   3. Routine OB: - Prenatal labs reviewed, as above - Rh positive - CBC, T&S, RPR on admit -  Labor diet , saline lock  4. Monitoring of labor  - Contractions monitored with external toco - Pelvis proven to 3570 grams, adequate for trial of labor  - Plan for expectant management  - Augmentation with oxytocin and AROM as appropriate  - Plan for  intermittent fetal monitoring  - Maternal pain control as desired; planning regional anesthesia - Anticipate vaginal delivery  5. History of 3rd degree perineal laceration and PPH  - Previous delivery of 3570 gram infant at [redacted]w[redacted]d with 3rd degree laceration.  - Previously counseled by Dr. Algis Downs. Schermerhorn about 5% risk of recurrence in future deliveries. Discussed vaginal delivery versus primary cesarean delivery. Yvette desires to proceed with trial of labor and vaginal delivery.  - Reviewed PPH likely related to lacerations. She previously received 2 units of pRBC. Discussed  risk/benefits of active management of 3rd stage of labor with oxytocin and TXA for second line medication. Yvette and family consent to active management with oxytocin and TXA.   6. Post Partum Planning: - Infant feeding: breast feeding - Contraception: no method - Flu vaccine:  declined  - Tdap vaccine:  declined  - RSV vaccine: declined   Gustavo Lah, CNM 02/06/24 10:27 PM  Margaretmary Eddy, CNM Certified Nurse Midwife [facility]

## 2024-02-06 NOTE — Anesthesia Procedure Notes (Signed)
Epidural Patient location during procedure: OB Start time: 02/06/2024 11:37 PM End time: 02/06/2024 11:41 PM  Staffing Anesthesiologist: Lenard Simmer, MD Performed: anesthesiologist   Preanesthetic Checklist Completed: patient identified, IV checked, site marked, risks and benefits discussed, surgical consent, monitors and equipment checked, pre-op evaluation and timeout performed  Epidural Patient position: sitting Prep: ChloraPrep Patient monitoring: heart rate, continuous pulse ox and blood pressure Approach: midline Location: L3-L4 Injection technique: LOR saline  Needle:  Needle type: Tuohy  Needle gauge: 17 G Needle length: 9 cm Needle insertion depth: 5 cm Catheter type: closed end flexible Catheter size: 19 Gauge Catheter at skin depth: 10 cm Test dose: negative and 1.5% lidocaine with Epi 1:200 K  Assessment Sensory level: T10 Events: blood not aspirated, no cerebrospinal fluid, injection not painful, no injection resistance, no paresthesia and negative IV test  Additional Notes 1st attempt Pt. Evaluated and documentation done after procedure finished. Patient identified. Risks/Benefits/Options discussed with patient including but not limited to bleeding, infection, nerve damage, paralysis, failed block, incomplete pain control, headache, blood pressure changes, nausea, vomiting, reactions to medication both or allergic, itching and postpartum back pain. Confirmed with bedside nurse the patient's most recent platelet count. Confirmed with patient that they are not currently taking any anticoagulation, have any bleeding history or any family history of bleeding disorders. Patient expressed understanding and wished to proceed. All questions were answered. Sterile technique was used throughout the entire procedure. Please see nursing notes for vital signs. Test dose was given through epidural catheter and negative prior to continuing to dose epidural or start infusion.  Warning signs of high block given to the patient including shortness of breath, tingling/numbness in hands, complete motor block, or any concerning symptoms with instructions to call for help. Patient was given instructions on fall risk and not to get out of bed. All questions and concerns addressed with instructions to call with any issues or inadequate analgesia.    Patient tolerated the insertion well without immediate complications.Reason for block:procedure for pain

## 2024-02-06 NOTE — OB Triage Note (Incomplete)
SROM 2/19 @ 1500. Contractions every 5-10 min per patient

## 2024-02-07 ENCOUNTER — Encounter: Payer: Self-pay | Admitting: Obstetrics and Gynecology

## 2024-02-07 LAB — RPR: RPR Ser Ql: NONREACTIVE

## 2024-02-07 MED ORDER — WITCH HAZEL-GLYCERIN EX PADS
1.0000 | MEDICATED_PAD | CUTANEOUS | Status: DC | PRN
  Filled 2024-02-07 (×2): qty 100

## 2024-02-07 MED ORDER — ACETAMINOPHEN 500 MG PO TABS
1000.0000 mg | ORAL_TABLET | Freq: Four times a day (QID) | ORAL | Status: DC
  Administered 2024-02-07 – 2024-02-08 (×5): 1000 mg via ORAL
  Filled 2024-02-07 (×5): qty 2

## 2024-02-07 MED ORDER — ACETAMINOPHEN 500 MG PO TABS: 1000.0000 mg | ORAL_TABLET | Freq: Four times a day (QID) | ORAL | Status: DC

## 2024-02-07 MED ORDER — BENZOCAINE-MENTHOL 20-0.5 % EX AERO
1.0000 | INHALATION_SPRAY | CUTANEOUS | Status: DC | PRN
  Filled 2024-02-07 (×2): qty 56

## 2024-02-07 MED ORDER — IBUPROFEN 600 MG PO TABS: 600.0000 mg | ORAL_TABLET | Freq: Four times a day (QID) | ORAL | Status: DC

## 2024-02-07 MED ORDER — TRANEXAMIC ACID-NACL 1000-0.7 MG/100ML-% IV SOLN
1000.0000 mg | Freq: Once | INTRAVENOUS | Status: AC | PRN
  Administered 2024-02-07: 1000 mg via INTRAVENOUS
  Filled 2024-02-07: qty 100

## 2024-02-07 MED ORDER — IBUPROFEN 600 MG PO TABS
600.0000 mg | ORAL_TABLET | Freq: Four times a day (QID) | ORAL | Status: DC
  Administered 2024-02-07 – 2024-02-08 (×5): 600 mg via ORAL
  Filled 2024-02-07 (×5): qty 1

## 2024-02-07 MED ORDER — DIBUCAINE (PERIANAL) 1 % EX OINT: 1.0000 | TOPICAL_OINTMENT | CUTANEOUS | Status: DC | PRN

## 2024-02-07 NOTE — Lactation Note (Signed)
This note was copied from a baby's chart. Lactation Consultation Note  Patient Name: Yvette Tran Today's Date: 02/07/2024 Age:25 hours Reason for consult: Initial assessment;Term;Mother's request;Breastfeeding assistance   Maternal Data This is mom's 2nd baby, SVD. Mom with history of post partum hemorrhage with 1st baby.  At first visit assisted mom with breastfeeding.  Has patient been taught Hand Expression?: Yes Does the patient have breastfeeding experience prior to this delivery?: Yes How long did the patient breastfeed?: 1 yr  Feeding Mother's Current Feeding Choice: Breast Milk Provided tips and strategies to maximize position and latch technique. Encouraged mom to erect nipple manually. After a few attempts baby latched well in football hold. Multiple swallows noted. LATCH Score Latch: Grasps breast easily, tongue down, lips flanged, rhythmical sucking.  Audible Swallowing: Spontaneous and intermittent  Type of Nipple: Everted at rest and after stimulation  Comfort (Breast/Nipple): Soft / non-tender  Hold (Positioning): Assistance needed to correctly position infant at breast and maintain latch.  LATCH Score: 9   Interventions Interventions: Breast feeding basics reviewed;Assisted with latch;Skin to skin;Breast massage;Hand express;Breast compression;Adjust position;Support pillows;Position options;Education   Consult Status Consult Status: Follow-up Date: 02/08/24 Follow-up type: In-patient  Update provided to care nurse.  Fuller Song 02/07/2024, 8:40 PM

## 2024-02-07 NOTE — Discharge Summary (Shared)
Postpartum Discharge Summary  Patient Name: Yvette Tran DOB: September 13, 2001 MRN: [id number]  Date of admission: 02/06/2024 Delivery date:02/07/2024 Delivering provider: Gustavo Lah Date of discharge: 02/08/2024  Primary OB: [facility] [id number] last menstrual period was 05/06/2023 (exact date). EDC Estimated Date of Delivery: 02/10/24 Gestational Age at Delivery: [redacted]w[redacted]d   Admitting diagnosis: Uterine contractions during pregnancy [O47.9] Normal labor [O80, Z37.9] Intrauterine pregnancy: [redacted]w[redacted]d     Secondary diagnosis:   Principal Problem:   Uterine contractions during pregnancy Active Problems:   History of blood transfusion   History of maternal third degree perineal laceration, currently pregnant in third trimester   History of postpartum hemorrhage   Normal labor   NSVD (normal spontaneous vaginal delivery)   Discharge Diagnosis: Term Pregnancy Delivered      Hospital course: Onset of Labor With Vaginal Delivery      23 y.o. yo [id number] at [redacted]w[redacted]d was admitted in Latent Labor on 02/06/2024. Labor course was uncomplicated.She was initially expectantly managed. Contractions decreased in frequency after epidural. AROM was performed for augmentation of labor. She progressed well to C/C/+3 with an urge to push.  She pushed effectively over approximately 10 minutes for a spontaneous vaginal birth.  Membrane Rupture Time/Date: 2:37 AM,02/07/2024  Delivery Method:Vaginal, Spontaneous Operative Delivery:N/A Episiotomy: None Lacerations:  2nd degree;Perineal Patient had an uncomplicated postpartum course.  She is ambulating, tolerating a regular diet, passing flatus, and urinating well. Patient is discharged home in stable condition on 02/08/24.  Newborn Data: Birth date:02/07/2024 Birth time:7:26 AM Gender:Female "Yvette Tran" Living status:Living Apgars:9 ,9  Weight:3310 g                                            Post partum procedures: None  Augmentation::  AROM Complications: None Delivery Type: spontaneous vaginal delivery Anesthesia: epidural anesthesia Placenta: spontaneous To Pathology: No   Prenatal Labs:  Blood type/Rh O POS Performed at [facility], [address] [place], [place] [address]    Antibody screen Negative    Rubella Immune (09/05 0000)   Varicella Immune  RPR Nonreactive (12/02 0000)   HBsAg Negative (09/05 0000)  Hep C NR   HIV Non-reactive (01/27 0000)   GC neg  Chlamydia neg  Genetic screening Declined   1 hour GTT 106  3 hour GTT N/A  GBS Negative/-- (01/27 0000)     Magnesium Sulfate received: No BMZ received: No Rhophylac:was not indicated MMR: was not indicated Varivax vaccine given: was not indicated - Tdap vaccine:  declined  - Flu vaccine:  declined  -RSV vaccine: declined   Transfusion: None   Physical exam  Vitals:   02/07/24 1655 02/07/24 2001 02/07/24 2308 02/08/24 0806  BP: 98/60 112/73 101/64 110/84  Pulse: 76 75 75 74  Resp: 18 20 20 20   Temp: 98.5 F (36.9 C) 98.4 F (36.9 C) 98.3 F (36.8 C) 98.4 F (36.9 C)  TempSrc: Oral Oral Oral Oral  SpO2: 100% 100% 99% 99%  Weight:      Height:       General: alert, cooperative, and no distress Lochia: appropriate Uterine Fundus: firm Perineum:minimal edema/repair well approximated DVT Evaluation: No evidence of DVT seen on physical exam.  Labs: Lab Results  Component Value Date   WBC 8.7 02/08/2024   HGB 12.0 02/08/2024   HCT 35.3 (L) 02/08/2024   MCV 82.3 02/08/2024   PLT 262  02/08/2024      Latest Ref Rng & Units 03/01/2022    9:10 PM  CMP  Glucose 70 - 99 mg/dL 96   BUN 6 - 20 mg/dL 9   Creatinine 1.61 - 0.96 mg/dL 0.45   Sodium 409 - 811 mmol/L 135   Potassium 3.5 - 5.1 mmol/L 3.4   Chloride 98 - 111 mmol/L 102   CO2 22 - 32 mmol/L 24   Calcium 8.9 - 10.3 mg/dL 9.1   Total Protein 6.5 - 8.1 g/dL 7.2   Total Bilirubin 0.3 - 1.2 mg/dL 0.5   Alkaline Phos 38 - 126 U/L 59   AST 15 - 41 U/L 26    ALT 0 - 44 U/L 62    Edinburgh Score:    07/31/2022    4:10 PM  Edinburgh Postnatal Depression Scale Screening Tool  I have been able to laugh and see the funny side of things. 0  I have looked forward with enjoyment to things. 0  I have blamed myself unnecessarily when things went wrong. 2  I have been anxious or worried for no good reason. 1  I have felt scared or panicky for no good reason. 1  Things have been getting on top of me. 1  I have been so unhappy that I have had difficulty sleeping. 0  I have felt sad or miserable. 0  I have been so unhappy that I have been crying. 0  The thought of harming myself has occurred to me. 0  Edinburgh Postnatal Depression Scale Total 5    Risk assessment for postpartum VTE and prophylactic treatment: Very high risk factors: None High risk factors: Blood transfusion (<5 units) Moderate risk factors: BMI 30-40 kg/m2  Postpartum VTE prophylaxis with LMWH not indicated  After visit meds:  Allergies as of 02/08/2024   No Known Allergies      Medication List     TAKE these medications    acetaminophen 325 MG tablet Commonly known as: Tylenol Take 2 tablets (650 mg total) by mouth every 4 (four) hours as needed.   benzocaine-Menthol 20-0.5 % Aero Commonly known as: DERMOPLAST Apply 1 Application topically as needed for irritation (perineal discomfort).   coconut oil Oil Apply 1 Application topically as needed.   dibucaine 1 % Oint Commonly known as: NUPERCAINAL Place 1 Application rectally as needed for hemorrhoids.   ibuprofen 600 MG tablet Commonly known as: ADVIL Take 1 tablet (600 mg total) by mouth every 6 (six) hours as needed.   multivitamin-prenatal 27-0.8 MG Tabs tablet Take 1 tablet by mouth daily at 12 noon.   senna-docusate 8.6-50 MG tablet Commonly known as: Senokot-S Take 2 tablets by mouth daily.   sertraline 50 MG tablet Commonly known as: ZOLOFT Take 50 mg by mouth daily.   simethicone 80 MG  chewable tablet Commonly known as: MYLICON Chew 1 tablet (80 mg total) by mouth as needed for flatulence.   witch hazel-glycerin pad Commonly known as: TUCKS Apply 1 Application topically as needed for hemorrhoids.       Discharge home in stable condition Infant Feeding: Breast Infant Disposition:home with mother Discharge instruction: per After Visit Summary and Postpartum booklet. Activity: Advance as tolerated. Pelvic rest for 6 weeks.  Diet: routine diet Anticipated Birth Control:  Contraceptives: None Postpartum Appointment:6 weeks Additional Postpartum F/U:  2 week mood check   Follow up Visit:  Follow-up Information     Gustavo Lah, CNM Follow up in 6 week(s).  Specialty: Certified Nurse Midwife Why: postpartum visit Contact information: [address] [place] [address] [phone]         Gustavo Lah, CNM Follow up in 2 week(s).   Specialty: Certified Nurse Midwife Why: mood Check Contact information: [address] [place] [address] [phone]                 Plan:  Yvette Tran was discharged to home in good condition. Follow-up appointment as directed.    Signed: Roney Jaffe, CNM

## 2024-02-07 NOTE — Progress Notes (Signed)
L&D Note    Subjective:  Feeling more comfortable after epidural   Objective:   Vitals:   02/07/24 0100 02/07/24 0130 02/07/24 0200 02/07/24 0230  BP: (!) 103/55 107/61 117/63 111/73  Pulse: 77 81 97 100  Resp:      Temp:      TempSrc:      SpO2: 97% 97% 95% 95%  Weight:      Height:        Current Vital Signs 24h Vital Sign Ranges  T 98.3 F (36.8 C) Temp  Avg: 98.2 F (36.8 C)  Min: 98.1 F (36.7 C)  Max: 98.3 F (36.8 C)  BP 111/73 BP  Min: 96/76  Max: 119/64  HR 100 Pulse  Avg: 90  Min: 77  Max: 102  RR 17 Resp  Avg: 18  Min: 17  Max: 19  SaO2 95 % Room Air SpO2  Avg: 96.5 %  Min: 95 %  Max: 99 %      Gen: alert, cooperative, no distress FHR: Baseline: 135 bpm, Variability: moderate, Accels: Present, Decels: none Toco: regular, every 5-7 minutes SVE: Dilation: 6 Effacement (%): 70 Station: -2 Exam by:: Tami Lin, CNM  Medications SCHEDULED MEDICATIONS   oxytocin 40 units in LR 1000 mL  333 mL Intravenous Once   sodium chloride flush  3 mL Intravenous Q12H    MEDICATION INFUSIONS   sodium chloride     fentaNYL 2 mcg/mL w/bupivacaine 0.125% in NS 250 mL 12 mL/hr (02/06/24 2352)   lactated ringers     lactated ringers 125 mL/hr at 02/07/24 0005   oxytocin      PRN MEDICATIONS  sodium chloride, acetaminophen, acetaminophen, calcium carbonate, diphenhydrAMINE, ePHEDrine, ePHEDrine, fentaNYL (SUBLIMAZE) injection, fentaNYL 2 mcg/mL w/bupivacaine 0.125% in NS 250 mL, lactated ringers, lidocaine (PF), ondansetron, phenylephrine, phenylephrine, sodium chloride flush, sodium citrate-citric acid   Assessment & Plan:  23 y.o. G2P1001 at [redacted]w[redacted]d admitted for normal labor  -Labor: Normal labor, cervical change noted from last exam but contractions are less frequent. Risk/benefits of AROM discussed for augmentation of labor. [place] is amenable to AROM. -Fetal Well-being: Category I -GBS: negative -Membranes ruptured, clear fluid, artificially at 0237 -Intervention: AROM,  maternal positioning  -Analgesia: regional anesthesia   Gustavo Lah, CNM  02/07/2024 2:45 AM  [facility]

## 2024-02-07 NOTE — Discharge Instructions (Signed)
 Marland Kitchen

## 2024-02-07 NOTE — Progress Notes (Signed)
L&D Note    Subjective:  Starting to feel pressure   Objective:   Vitals:   02/07/24 0500 02/07/24 0530 02/07/24 0600 02/07/24 0630  BP: (!) 99/56 (!) 106/59 114/65 108/65  Pulse: 89 90 98 98  Resp: 17     Temp: 98 F (36.7 C)     TempSrc: Oral     SpO2: 94% 96% 94% 95%  Weight:      Height:        Current Vital Signs 24h Vital Sign Ranges  T 98 F (36.7 C) Temp  Avg: 98.2 F (36.8 C)  Min: 98 F (36.7 C)  Max: 98.3 F (36.8 C)  BP 108/65 BP  Min: 91/45  Max: 119/64  HR 98 Pulse  Avg: 89.5  Min: 77  Max: 102  RR 17 Resp  Avg: 17.8  Min: 17  Max: 19  SaO2 95 % Room Air SpO2  Avg: 95.7 %  Min: 94 %  Max: 99 %      Gen: alert, cooperative, no distress FHR: Baseline: 140 bpm, Variability: moderate, Accels: Present, Decels: none Toco: regular, every 3-5 minutes SVE: Dilation: 10 Dilation Complete Date: 02/07/24 Dilation Complete Time: 0648 Effacement (%): 100 Station: Plus 2 Exam by:: Tami Lin, CNM  Medications SCHEDULED MEDICATIONS   oxytocin 40 units in LR 1000 mL  333 mL Intravenous Once   sodium chloride flush  3 mL Intravenous Q12H    MEDICATION INFUSIONS   sodium chloride     fentaNYL 2 mcg/mL w/bupivacaine 0.125% in NS 250 mL 12 mL/hr (02/06/24 2352)   lactated ringers     lactated ringers 125 mL/hr at 02/07/24 0005   oxytocin     tranexamic acid      PRN MEDICATIONS  sodium chloride, acetaminophen, acetaminophen, calcium carbonate, diphenhydrAMINE, ePHEDrine, ePHEDrine, fentaNYL (SUBLIMAZE) injection, fentaNYL 2 mcg/mL w/bupivacaine 0.125% in NS 250 mL, lactated ringers, lidocaine (PF), ondansetron, phenylephrine, phenylephrine, sodium chloride flush, sodium citrate-citric acid, tranexamic acid   Assessment & Plan:  23 y.o. G2P1001 at [redacted]w[redacted]d admitted for normal labor  -Labor: Active phase labor. Now 2nd stage. Will start pushing once delivery room prepared  -Fetal Well-being: Category I -GBS: negative -Membranes: ruptured, clear fluid, artificially at  0237 -Continue present management. -Analgesia: regional anesthesia   Gustavo Lah, CNM  02/07/2024 6:49 AM  [facility]

## 2024-02-07 NOTE — Anesthesia Preprocedure Evaluation (Signed)
Anesthesia Evaluation  Patient identified by MRN, date of birth, ID band Patient awake    Reviewed: Allergy & Precautions, NPO status , Patient's Chart, lab work & pertinent test results  History of Anesthesia Complications Negative for: history of anesthetic complications  Airway Mallampati: II  TM Distance: >3 FB Neck ROM: Full    Dental no notable dental hx. (+) Teeth Intact   Pulmonary neg pulmonary ROS, neg shortness of breath, neg sleep apnea, neg COPD, neg recent URI, Patient abstained from smoking.Not current smoker   Pulmonary exam normal breath sounds clear to auscultation       Cardiovascular Exercise Tolerance: Good METS(-) hypertension(-) CAD and (-) Past MI negative cardio ROS (-) dysrhythmias  Rhythm:Regular Rate:Normal - Systolic murmurs    Neuro/Psych negative neurological ROS  negative psych ROS   GI/Hepatic ,GERD  ,,(+)     (-) substance abuse    Endo/Other  neg diabetes    Renal/GU negative Renal ROS     Musculoskeletal   Abdominal   Peds  Hematology   Anesthesia Other Findings Past Medical History: 08/01/2022: Postpartum hemorrhage, delivered   Reproductive/Obstetrics (+) Pregnancy                             Anesthesia Physical Anesthesia Plan  ASA: 2  Anesthesia Plan: Epidural   Post-op Pain Management:    Induction:   PONV Risk Score and Plan: 2 and Treatment may vary due to age or medical condition and Ondansetron  Airway Management Planned: Natural Airway  Additional Equipment:   Intra-op Plan:   Post-operative Plan:   Informed Consent: I have reviewed the patients History and Physical, chart, labs and discussed the procedure including the risks, benefits and alternatives for the proposed anesthesia with the patient or authorized representative who has indicated his/her understanding and acceptance.       Plan Discussed with:  Surgeon  Anesthesia Plan Comments: (Discussed R/B/A of neuraxial anesthesia technique with patient: - rare risks of spinal/epidural hematoma, nerve damage, infection - Risk of PDPH - Risk of itching - Risk of nausea and vomiting - Risk of poor block necessitating replacement of epidural. - Risk of allergic reactions. Patient voiced understanding.)        Anesthesia Quick Evaluation

## 2024-02-08 LAB — CBC
HCT: 35.3 % — ABNORMAL LOW (ref 36.0–46.0)
Hemoglobin: 12 g/dL (ref 12.0–15.0)
MCH: 28 pg (ref 26.0–34.0)
MCHC: 34 g/dL (ref 30.0–36.0)
MCV: 82.3 fL (ref 80.0–100.0)
Platelets: 262 10*3/uL (ref 150–400)
RBC: 4.29 MIL/uL (ref 3.87–5.11)
RDW: 13.8 % (ref 11.5–15.5)
WBC: 8.7 10*3/uL (ref 4.0–10.5)
nRBC: 0 % (ref 0.0–0.2)

## 2024-02-08 MED ORDER — PRENATAL MULTIVITAMIN CH
1.0000 | ORAL_TABLET | Freq: Every day | ORAL | Status: DC
  Administered 2024-02-08: 1 via ORAL
  Filled 2024-02-08: qty 1

## 2024-02-08 MED ORDER — ACETAMINOPHEN 325 MG PO TABS: 650.0000 mg | ORAL_TABLET | ORAL | Status: AC | PRN

## 2024-02-08 MED ORDER — ZOLPIDEM TARTRATE 5 MG PO TABS: 5.0000 mg | ORAL_TABLET | Freq: Every evening | ORAL | Status: DC | PRN

## 2024-02-08 MED ORDER — IBUPROFEN 600 MG PO TABS: 600.0000 mg | ORAL_TABLET | Freq: Four times a day (QID) | ORAL | 0 refills | Status: AC | PRN

## 2024-02-08 MED ORDER — COCONUT OIL OIL
1.0000 | TOPICAL_OIL | Status: AC | PRN
Start: 2024-02-08 — End: ?

## 2024-02-08 MED ORDER — DIPHENHYDRAMINE HCL 25 MG PO CAPS: 25.0000 mg | ORAL_CAPSULE | Freq: Four times a day (QID) | ORAL | Status: DC | PRN

## 2024-02-08 MED ORDER — ONDANSETRON HCL 4 MG/2ML IJ SOLN: 4.0000 mg | INTRAMUSCULAR | Status: DC | PRN

## 2024-02-08 MED ORDER — SODIUM CHLORIDE 0.9% FLUSH: 3.0000 mL | Freq: Two times a day (BID) | INTRAVENOUS | Status: DC

## 2024-02-08 MED ORDER — SIMETHICONE 80 MG PO CHEW: 80.0000 mg | CHEWABLE_TABLET | ORAL | Status: AC | PRN

## 2024-02-08 MED ORDER — DIBUCAINE (PERIANAL) 1 % EX OINT
1.0000 | TOPICAL_OINTMENT | CUTANEOUS | Status: AC | PRN
Start: 2024-02-08 — End: ?

## 2024-02-08 MED ORDER — ONDANSETRON HCL 4 MG PO TABS: 4.0000 mg | ORAL_TABLET | ORAL | Status: DC | PRN

## 2024-02-08 MED ORDER — SENNOSIDES-DOCUSATE SODIUM 8.6-50 MG PO TABS
2.0000 | ORAL_TABLET | Freq: Every day | ORAL | Status: AC
Start: 2024-02-08 — End: ?

## 2024-02-08 MED ORDER — WITCH HAZEL-GLYCERIN EX PADS: 1.0000 | MEDICATED_PAD | CUTANEOUS | Status: AC | PRN

## 2024-02-08 MED ORDER — BENZOCAINE-MENTHOL 20-0.5 % EX AERO
1.0000 | INHALATION_SPRAY | CUTANEOUS | Status: AC | PRN
Start: 2024-02-08 — End: ?

## 2024-02-08 MED ORDER — SODIUM CHLORIDE 0.9% FLUSH: 3.0000 mL | INTRAVENOUS | Status: DC | PRN

## 2024-02-08 MED ORDER — WITCH HAZEL-GLYCERIN EX PADS: 1.0000 | MEDICATED_PAD | CUTANEOUS | 12 refills | Status: DC | PRN

## 2024-02-08 MED ORDER — SENNOSIDES-DOCUSATE SODIUM 8.6-50 MG PO TABS
2.0000 | ORAL_TABLET | Freq: Every day | ORAL | Status: DC
  Administered 2024-02-08: 2 via ORAL
  Filled 2024-02-08: qty 2

## 2024-02-08 MED ORDER — SIMETHICONE 80 MG PO CHEW: 80.0000 mg | CHEWABLE_TABLET | ORAL | Status: DC | PRN

## 2024-02-08 MED ORDER — COCONUT OIL OIL: 1.0000 | TOPICAL_OIL | Status: DC | PRN

## 2024-02-08 MED ORDER — FERROUS SULFATE 325 (65 FE) MG PO TABS
325.0000 mg | ORAL_TABLET | Freq: Two times a day (BID) | ORAL | Status: DC
  Administered 2024-02-08: 325 mg via ORAL
  Filled 2024-02-08: qty 1

## 2024-02-08 MED ORDER — SODIUM CHLORIDE 0.9 % IV SOLN: 250.0000 mL | INTRAVENOUS | Status: DC | PRN

## 2024-02-08 NOTE — Progress Notes (Signed)
 Patient discharged. Discharge instructions given. Patient verbalizes understanding. Transported by axillary.

## 2024-02-08 NOTE — Lactation Note (Signed)
This note was copied from a baby's chart. Lactation Consultation Note  Patient Name: Yvette Tran Today's Date: 02/08/2024 Age:25 hours Reason for consult: Follow-up assessment;Term;Other (Comment) (Discharge Education)   Maternal Data Follow up assessment and discharge education w/ a 27hr old baby Yvette.  Patient stated that she feels infant has been a little shallow with latch and she knows she has been tired so this may be why the latches aren't as good.  Feeding Mother's Current Feeding Choice: Breast Milk  LC observed a feeding at the breast.  Patient placed infant on the left breast in cradle hold.  Initial latch was very painful to patient. LC had patient unlatch infant and reposition infant at the breast.  Support pillows were provided to help patient feel comfortable.  LC provided tips/tricks to help infant maintain a deep latch.  On the second attempt, infant was able to latch deeply with no pain verbalized patient.    Interventions Interventions: Breast feeding basics reviewed;Assisted with latch;Adjust position;Support pillows;Position options;Education;CDC Guidelines for Breast Pump Cleaning  Discharge Discharge Education: Engorgement and breast care;Warning signs for feeding baby;Outpatient recommendation  Education on engorgement prevention/treatment was discussed as well as breastmilk storage guidelines.  LC provided patient with a handout on breastmilk storage guidelines from [facility]. [facility] outpatient lactation services phone number written on the white board in the room.  Patient verbalized understanding.  LC also provided education from the postpartum book about warning signs to look for in a poor feeding.   Consult Status Consult Status: Complete Follow-up type: Call as needed    Yvette Rack Donterrius Santucci 02/08/2024, 10:40 AM

## 2024-02-08 NOTE — Anesthesia Postprocedure Evaluation (Signed)
Anesthesia Post Note  Patient: Yvette Tran  Procedure(s) Performed: AN AD HOC LABOR EPIDURAL  Patient location during evaluation: Mother Baby Anesthesia Type: Epidural Level of consciousness: awake and alert Pain management: pain level controlled Vital Signs Assessment: post-procedure vital signs reviewed and stable Respiratory status: spontaneous breathing, nonlabored ventilation and respiratory function stable Cardiovascular status: stable Postop Assessment: no headache, no backache, epidural receding and able to ambulate Anesthetic complications: no   No notable events documented.   Last Vitals:  Vitals:   02/07/24 2001 02/07/24 2308  BP: 112/73 101/64  Pulse: 75 75  Resp: 20 20  Temp: 36.9 C 36.8 C  SpO2: 100% 99%    Last Pain:  Vitals:   02/08/24 0101  TempSrc:   PainSc: 3                  Rosanne Gutting
